# Patient Record
Sex: Male | Born: 1945 | Race: White | Hispanic: No | Marital: Married | Smoking: Never smoker
Health system: Southern US, Community
[De-identification: ages and names within clinical notes are randomized; demographics above are authoritative.]

## PROBLEM LIST (undated history)

## (undated) DIAGNOSIS — I10 Essential (primary) hypertension: Secondary | ICD-10-CM

## (undated) DIAGNOSIS — G4730 Sleep apnea, unspecified: Secondary | ICD-10-CM

## (undated) DIAGNOSIS — J3089 Other allergic rhinitis: Secondary | ICD-10-CM

## (undated) DIAGNOSIS — I2510 Atherosclerotic heart disease of native coronary artery without angina pectoris: Secondary | ICD-10-CM

## (undated) DIAGNOSIS — H269 Unspecified cataract: Secondary | ICD-10-CM

## (undated) DIAGNOSIS — H9190 Unspecified hearing loss, unspecified ear: Secondary | ICD-10-CM

## (undated) DIAGNOSIS — K429 Umbilical hernia without obstruction or gangrene: Secondary | ICD-10-CM

## (undated) DIAGNOSIS — K219 Gastro-esophageal reflux disease without esophagitis: Secondary | ICD-10-CM

## (undated) DIAGNOSIS — E119 Type 2 diabetes mellitus without complications: Secondary | ICD-10-CM

## (undated) DIAGNOSIS — R140 Abdominal distension (gaseous): Secondary | ICD-10-CM

## (undated) DIAGNOSIS — M1990 Unspecified osteoarthritis, unspecified site: Secondary | ICD-10-CM

## (undated) DIAGNOSIS — K760 Fatty (change of) liver, not elsewhere classified: Secondary | ICD-10-CM

## (undated) HISTORY — PX: CARDIAC CATHETERIZATION: SHX172

## (undated) HISTORY — PX: JOINT REPLACEMENT: SHX530

## (undated) HISTORY — DX: Abdominal distension (gaseous): R14.0

## (undated) HISTORY — DX: Type 2 diabetes mellitus without complications: E11.9

## (undated) HISTORY — PX: TREATMENT FISTULA ANAL: SUR1390

## (undated) HISTORY — PX: CHOLECYSTECTOMY: SHX55

---

## 1967-05-19 HISTORY — PX: TONSILLECTOMY: SUR1361

## 2000-05-18 HISTORY — PX: CORONARY ARTERY BYPASS GRAFT: SHX141

## 2000-12-22 ENCOUNTER — Encounter: Payer: Self-pay | Admitting: Cardiology

## 2000-12-22 ENCOUNTER — Inpatient Hospital Stay (HOSPITAL_COMMUNITY): Admission: RE | Admit: 2000-12-22 | Discharge: 2000-12-27 | Payer: Self-pay | Admitting: Cardiology

## 2000-12-23 ENCOUNTER — Encounter: Payer: Self-pay | Admitting: Thoracic Surgery (Cardiothoracic Vascular Surgery)

## 2000-12-24 ENCOUNTER — Encounter: Payer: Self-pay | Admitting: Thoracic Surgery (Cardiothoracic Vascular Surgery)

## 2000-12-25 ENCOUNTER — Encounter: Payer: Self-pay | Admitting: Thoracic Surgery (Cardiothoracic Vascular Surgery)

## 2001-01-18 ENCOUNTER — Encounter: Payer: Self-pay | Admitting: Thoracic Surgery (Cardiothoracic Vascular Surgery)

## 2001-01-18 ENCOUNTER — Encounter
Admission: RE | Admit: 2001-01-18 | Discharge: 2001-01-18 | Payer: Self-pay | Admitting: Thoracic Surgery (Cardiothoracic Vascular Surgery)

## 2001-01-31 ENCOUNTER — Encounter
Admission: RE | Admit: 2001-01-31 | Discharge: 2001-01-31 | Payer: Self-pay | Admitting: Thoracic Surgery (Cardiothoracic Vascular Surgery)

## 2001-01-31 ENCOUNTER — Encounter: Payer: Self-pay | Admitting: Thoracic Surgery (Cardiothoracic Vascular Surgery)

## 2001-07-18 ENCOUNTER — Encounter: Payer: Self-pay | Admitting: Thoracic Surgery (Cardiothoracic Vascular Surgery)

## 2001-07-18 ENCOUNTER — Encounter
Admission: RE | Admit: 2001-07-18 | Discharge: 2001-07-18 | Payer: Self-pay | Admitting: Thoracic Surgery (Cardiothoracic Vascular Surgery)

## 2002-09-29 ENCOUNTER — Ambulatory Visit (HOSPITAL_COMMUNITY): Admission: RE | Admit: 2002-09-29 | Discharge: 2002-09-29 | Payer: Self-pay | Admitting: Neurology

## 2004-03-25 ENCOUNTER — Ambulatory Visit: Payer: Self-pay | Admitting: Cardiology

## 2004-03-28 ENCOUNTER — Ambulatory Visit: Payer: Self-pay | Admitting: Cardiology

## 2004-04-08 ENCOUNTER — Ambulatory Visit: Payer: Self-pay | Admitting: Cardiology

## 2010-07-16 ENCOUNTER — Other Ambulatory Visit (HOSPITAL_COMMUNITY): Payer: Self-pay | Admitting: Orthopedic Surgery

## 2010-07-16 ENCOUNTER — Ambulatory Visit (HOSPITAL_COMMUNITY)
Admission: RE | Admit: 2010-07-16 | Discharge: 2010-07-16 | Disposition: A | Discharge status: Home / Self Care | Payer: BC Managed Care – PPO | Source: Ambulatory Visit | Attending: Orthopedic Surgery | Admitting: Orthopedic Surgery

## 2010-07-16 ENCOUNTER — Encounter (HOSPITAL_COMMUNITY)
Admission: RE | Admit: 2010-07-16 | Discharge: 2010-07-16 | Disposition: A | Discharge status: Home / Self Care | Payer: BC Managed Care – PPO | Source: Ambulatory Visit | Attending: Orthopedic Surgery | Admitting: Orthopedic Surgery

## 2010-07-16 DIAGNOSIS — R059 Cough, unspecified: Secondary | ICD-10-CM | POA: Insufficient documentation

## 2010-07-16 DIAGNOSIS — M19019 Primary osteoarthritis, unspecified shoulder: Secondary | ICD-10-CM | POA: Insufficient documentation

## 2010-07-16 DIAGNOSIS — Z01812 Encounter for preprocedural laboratory examination: Secondary | ICD-10-CM | POA: Insufficient documentation

## 2010-07-16 DIAGNOSIS — Z01818 Encounter for other preprocedural examination: Secondary | ICD-10-CM | POA: Insufficient documentation

## 2010-07-16 DIAGNOSIS — R05 Cough: Secondary | ICD-10-CM | POA: Insufficient documentation

## 2010-07-16 LAB — DIFFERENTIAL
Basophils Absolute: 0 10*3/uL (ref 0.0–0.1)
Eosinophils Relative: 2 % (ref 0–5)
Lymphocytes Relative: 24 % (ref 12–46)
Lymphs Abs: 1.2 10*3/uL (ref 0.7–4.0)
Monocytes Absolute: 0.4 10*3/uL (ref 0.1–1.0)
Neutro Abs: 3.4 10*3/uL (ref 1.7–7.7)

## 2010-07-16 LAB — URINALYSIS, ROUTINE W REFLEX MICROSCOPIC
Bilirubin Urine: NEGATIVE
Hgb urine dipstick: NEGATIVE
Protein, ur: NEGATIVE mg/dL
Specific Gravity, Urine: 1.027 (ref 1.005–1.030)
Urine Glucose, Fasting: 100 mg/dL — AB

## 2010-07-16 LAB — CBC
HCT: 45.6 % (ref 39.0–52.0)
Hemoglobin: 15.4 g/dL (ref 13.0–17.0)
MCHC: 33.8 g/dL (ref 30.0–36.0)
MCV: 89.8 fL (ref 78.0–100.0)
RDW: 12.8 % (ref 11.5–15.5)

## 2010-07-16 LAB — BASIC METABOLIC PANEL
Calcium: 9.3 mg/dL (ref 8.4–10.5)
GFR calc Af Amer: >60 mL/min (ref >60)
GFR calc non Af Amer: >60 mL/min (ref >60)
Potassium: 4.2 mEq/L (ref 3.5–5.1)
Sodium: 143 mEq/L (ref 135–145)

## 2010-07-16 LAB — TYPE AND SCREEN
ABO/RH(D): O POS
Antibody Screen: NEGATIVE

## 2010-07-16 LAB — PROTIME-INR
INR: 1.07 (ref 0.00–1.49)
Prothrombin Time: 14.1 seconds (ref 11.6–15.2)

## 2010-07-16 LAB — SURGICAL PCR SCREEN: MRSA, PCR: NEGATIVE

## 2010-07-17 HISTORY — PX: SHOULDER ARTHROSCOPY W/ ROTATOR CUFF REPAIR: SHX2400

## 2010-07-18 ENCOUNTER — Observation Stay (HOSPITAL_COMMUNITY)
Admission: RE | Admit: 2010-07-18 | Discharge: 2010-07-20 | Disposition: A | Discharge status: Home / Self Care | Payer: BC Managed Care – PPO | Source: Ambulatory Visit | Attending: Orthopedic Surgery | Admitting: Orthopedic Surgery

## 2010-07-18 ENCOUNTER — Inpatient Hospital Stay (HOSPITAL_COMMUNITY): Payer: BC Managed Care – PPO

## 2010-07-18 DIAGNOSIS — K219 Gastro-esophageal reflux disease without esophagitis: Secondary | ICD-10-CM | POA: Insufficient documentation

## 2010-07-18 DIAGNOSIS — I2510 Atherosclerotic heart disease of native coronary artery without angina pectoris: Secondary | ICD-10-CM | POA: Insufficient documentation

## 2010-07-18 DIAGNOSIS — Z951 Presence of aortocoronary bypass graft: Secondary | ICD-10-CM | POA: Insufficient documentation

## 2010-07-18 DIAGNOSIS — M19019 Primary osteoarthritis, unspecified shoulder: Principal | ICD-10-CM | POA: Insufficient documentation

## 2010-07-18 DIAGNOSIS — N400 Enlarged prostate without lower urinary tract symptoms: Secondary | ICD-10-CM | POA: Insufficient documentation

## 2010-07-18 DIAGNOSIS — Z79899 Other long term (current) drug therapy: Secondary | ICD-10-CM | POA: Insufficient documentation

## 2010-07-19 LAB — BASIC METABOLIC PANEL
BUN: 10 mg/dL (ref 6–23)
Calcium: 8.8 mg/dL (ref 8.4–10.5)
Creatinine, Ser: 0.88 mg/dL (ref 0.4–1.5)
GFR calc non Af Amer: >60 mL/min (ref >60)
Glucose, Bld: 108 mg/dL — ABNORMAL HIGH (ref 70–99)

## 2010-07-19 LAB — CBC
HCT: 36.4 % — ABNORMAL LOW (ref 39.0–52.0)
MCH: 29.9 pg (ref 26.0–34.0)
MCHC: 33.2 g/dL (ref 30.0–36.0)
MCV: 89.9 fL (ref 78.0–100.0)
RDW: 13 % (ref 11.5–15.5)

## 2010-08-02 NOTE — Op Note (Signed)
NAMEXZAYVION, VAETH NO.:  [id number]  MEDICAL RECORD NO.:  [id number]           PATIENT TYPE:  I  LOCATION:  5035                         FACILITY:  [facility]  PHYSICIAN:  Almedia Balls. Ranell Patrick, M.D. DATE OF BIRTH:  1946/02/06  DATE OF PROCEDURE:  07/18/2010 DATE OF DISCHARGE:                              OPERATIVE REPORT   PREOPERATIVE DIAGNOSIS:  Left shoulder, end-stage osteoarthritis.  POSTOPERATIVE DIAGNOSIS:  Left shoulder, end-stage osteoarthritis.  PROCEDURE PERFORMED:  Left total shoulder arthroplasty, DePuy global advantage system.  ATTENDING SURGEON:  Almedia Balls. Ranell Patrick, MD  ASSISTANT:  Donnie Coffin. Dixon, PA-C.  ANESTHESIA:  General anesthesia was used plus interscalene block.  ESTIMATED BLOOD LOSS:  150 mL.  FLUID REPLACEMENT:  2000 mL crystalloid.  INSTRUMENT COUNTS:  Correct.  COMPLICATIONS:  There were no complications.  Perioperative antibiotics were given.  INDICATIONS:  The patient is a 65 year old male with history of worsening arthritis in the left shoulder.  The patient has had progressive pain and loss of function in his left shoulder.  The patient presents with progressive arthritic changes on x-ray and clinical findings consistent with a arthritic left shoulder, desiring total shoulder arthroplasty.  Informed consent obtained.  DESCRIPTION OF THE OPERATION:  After adequate local anesthesia was achieved, the patient positioned in the modified beach-chair position. Left shoulder was sterilely prepped and draped in usual manner. Deltopectoral approach was utilized starting at the coracoid process, extending down to the anterior humerus.  Dissection down through subcutaneous tissues using the Bovie.  The deltopectoral interval was identified and the cephalic vein was taken laterally with the deltoid and pectoralis was taken medially.  We released the upper centimeter pectoralis off the humerus.  We then identified the conjoined  tendon, took that medially.  We took the subscap sharply off the lesser tuberosity using the Bovie.  We then placed #2 FiberWire suture in a modified Mason-Allen suture technique into the free end of the subscapularis tendon.  We then released soft tissue off the humerus progressively externally rotating the humerus and doing a full soft tissue release off the humeral neck.  We then went ahead and placed a T- handled Crego elevator at the top of the humeral head and resected using neck resection guide with an oscillating saw with about 15 degrees or so of retroversion.  At this point, we went ahead and prepared our humerus with sequential broaching up to size 14 with broach in place.  We then subluxed the humerus posteriorly.  We removed the anterior-inferior capsule and we were careful to protect the axillary nerve.  We then removed the posterior capsule carefully and placed retractors posteriorly.  We then used a curette to remove excess cartilage off the anterior aspect of the glenoid.  There was really no cartilage on the posterior aspect of glenoid.  We then placed our center guide pin for 48 glenoid and then did our reaming with the low profile reamer, down to subchondral bone.  Next, we went ahead and used the hand reamer to ream peripherally and then went ahead and drilled out our central peg to the anchor peg glenoid.  Once we did that,  we placed our guide for the peripheral holes and drilled those without complication.  We had good content bony purchase with all those holes.  We then went and we trialed with a 48 socket and we are happy with that.  We then went ahead and placed a 48 humeral head component on, by some  48 x 21 eccentric and then got the eccentricity superiorly and posteriorly with good humeral head coverage.  We reduced the shoulder, and we are happy with soft tissue balance.  We then removed trial implants.  We drilled holes in the proximal humerus at the lesser  tuberosity placed, #2 FiberWire suture in and therefore I repaired the subscapularis.  Next, we went ahead and impacted the real 14 stem.  This was global advantage stem and we then impaction grafting technique with lot of cancellous bone graft medially to help keep that stem out laterally straight up and down. Once that was impacted in the position, we again addressed the glenoid. We subluxed the humerus posteriorly, placed our retractors, removed the trial implant.  Then we went ahead and put in some Gelfoam soaked with thrombin into the peripheral holes, and allowed that to dry while we mixed the cement on the back table.  We used DePuy high viscosity cement, injected that into the holes peripherally and then placed our 48- anchor peg glenoid into position, impacted that held in place until the cement hardened.  Once the cement was hardened on the back table, we checked the glenoid implant which was stable grossly and then again delivered the humerus into good position where we could visualize that, we thoroughly irrigated and then placed the real 48 x 21 eccentric and impacted that in position with good head coverage, reduced the shoulder, thoroughly irrigated and then went ahead and repaired the subscapularis anatomically using 3 bone sutures as well as rotator interval suturing and had a nice anatomic repair, not under tension to a full arc of motion.  We were able to externally rotate about 45 degrees and really and really nice passive motion with nice gliding and no tethering and no impingement.  We then closed deltopectoral interval with 0 Vicryl suture followed by 2-0 Vicryl for subcutaneous closure and 4-0 Monocryl for skin.  Steri-Strips applied followed by sterile dressing.  The patient tolerated surgery well.     Almedia Balls. Ranell Patrick, M.D.     SRN/MEDQ  D:  07/18/2010  T:  07/19/2010  Job:  [id number]  Electronically Signed by Malon Kindle  on 08/02/2010 11:22:21 AM

## 2010-09-10 NOTE — Discharge Summary (Signed)
  Joseph Burton, Joseph Burton NO.:  [id number]  MEDICAL RECORD NO.:  [id number]           PATIENT TYPE:  I  LOCATION:  5035                         FACILITY:  [facility]  PHYSICIAN:  Almedia Balls. Ranell Patrick, M.D. DATE OF BIRTH:  March 14, 1946  DATE OF ADMISSION:  07/18/2010 DATE OF DISCHARGE:  07/20/2010                              DISCHARGE SUMMARY   ADMISSION DIAGNOSIS:  Left shoulder end-stage osteoarthritis.  DISCHARGE DIAGNOSIS:  Left shoulder end-stage osteoarthritis, status post total shoulder arthroplasty.  BRIEF HISTORY:  The patient is a 65 year old male with worsening left shoulder pain secondary to end-stage osteoarthritis.  The patient elected to have a left total shoulder arthroplasty by Dr. Malon Kindle to decrease pain and increase function.  PROCEDURE:  The patient had a left total shoulder arthroplasty by Dr. Malon Kindle on July 18, 2010.  Standley Dakins, [facility] was assistant. General anesthesia was used.  No complications.  HOSPITAL COURSE:  The patient was admitted on July 18, 2010, for the above-stated procedure which he tolerated well.  After adequate time in Post Anesthesia Care Unit, he was transferred up to 5000.  On postop day #1, the patient complained of minimal pain due to left shoulder, he was actually doing quite well.  Worked with Physical Therapy and Occupational Therapy quite well over the next 2 days and thus the patient will be discharged home with Home Health.  His labs were within acceptable limits.  The patient was afebrile.  Overall, his hospital stay was uneventful.  DISCHARGE PLAN:  The patient will be discharged home on July 21, 2010.  CONDITION:  Stable.  DIET:  Regular.  DISCHARGE MEDICATIONS: 1. Robaxin 500 mg p.o. q.6 hours. 2. Percocet 5/325, 1-2 tabs q.4-6 hours p.r.n. pain.     Thomas B. Dixon, P.A.   ______________________________ Almedia Balls. Ranell Patrick, M.D.    TBD/MEDQ  D:  08/21/2010  T:  08/22/2010  Job:   [id number]  Electronically Signed by Standley Dakins P.A. on 09/02/2010 10:18:33 AM Electronically Signed by Malon Kindle  on 09/10/2010 05:15:42 PM

## 2011-10-01 ENCOUNTER — Encounter (INDEPENDENT_AMBULATORY_CARE_PROVIDER_SITE_OTHER): Payer: Self-pay | Admitting: *Deleted

## 2011-10-15 ENCOUNTER — Ambulatory Visit (INDEPENDENT_AMBULATORY_CARE_PROVIDER_SITE_OTHER): Payer: BC Managed Care – PPO | Admitting: Internal Medicine

## 2015-07-01 DIAGNOSIS — E7800 Pure hypercholesterolemia, unspecified: Secondary | ICD-10-CM | POA: Diagnosis not present

## 2015-07-01 DIAGNOSIS — I2510 Atherosclerotic heart disease of native coronary artery without angina pectoris: Secondary | ICD-10-CM | POA: Diagnosis not present

## 2015-07-01 DIAGNOSIS — Z1211 Encounter for screening for malignant neoplasm of colon: Secondary | ICD-10-CM | POA: Diagnosis not present

## 2015-07-01 DIAGNOSIS — E1165 Type 2 diabetes mellitus with hyperglycemia: Secondary | ICD-10-CM | POA: Diagnosis not present

## 2015-07-02 ENCOUNTER — Encounter (INDEPENDENT_AMBULATORY_CARE_PROVIDER_SITE_OTHER): Payer: Self-pay | Admitting: *Deleted

## 2015-07-05 DIAGNOSIS — E1165 Type 2 diabetes mellitus with hyperglycemia: Secondary | ICD-10-CM | POA: Diagnosis not present

## 2015-07-11 ENCOUNTER — Other Ambulatory Visit (INDEPENDENT_AMBULATORY_CARE_PROVIDER_SITE_OTHER): Payer: Self-pay | Admitting: *Deleted

## 2015-07-11 ENCOUNTER — Encounter (INDEPENDENT_AMBULATORY_CARE_PROVIDER_SITE_OTHER): Payer: Self-pay | Admitting: *Deleted

## 2015-07-11 DIAGNOSIS — Z800 Family history of malignant neoplasm of digestive organs: Secondary | ICD-10-CM

## 2015-07-11 DIAGNOSIS — Z1211 Encounter for screening for malignant neoplasm of colon: Secondary | ICD-10-CM

## 2015-07-26 DIAGNOSIS — E7800 Pure hypercholesterolemia, unspecified: Secondary | ICD-10-CM | POA: Diagnosis not present

## 2015-07-26 DIAGNOSIS — E119 Type 2 diabetes mellitus without complications: Secondary | ICD-10-CM | POA: Diagnosis not present

## 2015-07-26 DIAGNOSIS — I10 Essential (primary) hypertension: Secondary | ICD-10-CM | POA: Diagnosis not present

## 2015-07-26 DIAGNOSIS — I2510 Atherosclerotic heart disease of native coronary artery without angina pectoris: Secondary | ICD-10-CM | POA: Diagnosis not present

## 2015-08-19 DIAGNOSIS — Z789 Other specified health status: Secondary | ICD-10-CM | POA: Diagnosis not present

## 2015-08-19 DIAGNOSIS — I2510 Atherosclerotic heart disease of native coronary artery without angina pectoris: Secondary | ICD-10-CM | POA: Diagnosis not present

## 2015-08-19 DIAGNOSIS — R05 Cough: Secondary | ICD-10-CM | POA: Diagnosis not present

## 2015-08-19 DIAGNOSIS — E1165 Type 2 diabetes mellitus with hyperglycemia: Secondary | ICD-10-CM | POA: Diagnosis not present

## 2015-08-19 DIAGNOSIS — Z6831 Body mass index (BMI) 31.0-31.9, adult: Secondary | ICD-10-CM | POA: Diagnosis not present

## 2015-08-19 DIAGNOSIS — J069 Acute upper respiratory infection, unspecified: Secondary | ICD-10-CM | POA: Diagnosis not present

## 2015-08-23 ENCOUNTER — Other Ambulatory Visit (INDEPENDENT_AMBULATORY_CARE_PROVIDER_SITE_OTHER): Payer: Self-pay | Admitting: *Deleted

## 2015-08-23 ENCOUNTER — Encounter (INDEPENDENT_AMBULATORY_CARE_PROVIDER_SITE_OTHER): Payer: Self-pay | Admitting: *Deleted

## 2015-08-23 MED ORDER — PEG 3350-KCL-NA BICARB-NACL 420 G PO SOLR: 4000.0000 mL | Freq: Once | ORAL | Status: DC

## 2015-08-23 NOTE — Telephone Encounter (Signed)
trilyte

## 2015-09-06 ENCOUNTER — Telehealth (INDEPENDENT_AMBULATORY_CARE_PROVIDER_SITE_OTHER): Payer: Self-pay | Admitting: *Deleted

## 2015-09-06 NOTE — Telephone Encounter (Signed)
Referring MD/PCP: vyas   Procedure: tcs  Reason/Indication:  Screening, fam hx colon ca  Has patient had this procedure before?  Yes, 10 yrs ago  If so, when, by whom and where?    Is there a family history of colon cancer?  Yes, father  Who?  What age when diagnosed?    Is patient diabetic?   borderline      Does patient have prosthetic heart valve or mechanical valve?  no  Do you have a pacemaker?  no  Has patient ever had endocarditis? No   Double bypass 2002  Has patient had joint replacement within last 12 months?  no  Does patient tend to be constipated or take laxatives? no  Does patient have a history of alcohol/drug use?  no  Is patient on Coumadin, Plavix and/or Aspirin? yes  Medications: asa 81 mg daily, atenolol 50 mg 1/2 tab daily, omeprazole 20 mg bid, uroxatral 10 mg daily, nabumetone 750 mg bid, gemfibrozil 600 mg daily, metformin 500 mg daily  Allergies: nkda  Medication Adjustment: asa 2 days, hold metformin evening before  Procedure date & time: 10/02/15 at 930

## 2015-09-10 NOTE — Telephone Encounter (Signed)
agree

## 2015-10-02 ENCOUNTER — Ambulatory Visit (HOSPITAL_COMMUNITY)
Admission: RE | Admit: 2015-10-02 | Discharge: 2015-10-02 | Disposition: A | Discharge status: Home / Self Care | Payer: Medicare Other | Source: Ambulatory Visit | Attending: Internal Medicine | Admitting: Internal Medicine

## 2015-10-02 ENCOUNTER — Encounter (HOSPITAL_COMMUNITY): Payer: Self-pay

## 2015-10-02 ENCOUNTER — Encounter (HOSPITAL_COMMUNITY)
Admission: RE | Disposition: A | Discharge status: Home / Self Care | Payer: Self-pay | Source: Ambulatory Visit | Attending: Internal Medicine

## 2015-10-02 DIAGNOSIS — K644 Residual hemorrhoidal skin tags: Secondary | ICD-10-CM | POA: Diagnosis not present

## 2015-10-02 DIAGNOSIS — M1991 Primary osteoarthritis, unspecified site: Secondary | ICD-10-CM | POA: Diagnosis not present

## 2015-10-02 DIAGNOSIS — Z7984 Long term (current) use of oral hypoglycemic drugs: Secondary | ICD-10-CM | POA: Insufficient documentation

## 2015-10-02 DIAGNOSIS — Z1211 Encounter for screening for malignant neoplasm of colon: Secondary | ICD-10-CM | POA: Diagnosis not present

## 2015-10-02 DIAGNOSIS — D120 Benign neoplasm of cecum: Secondary | ICD-10-CM | POA: Diagnosis not present

## 2015-10-02 DIAGNOSIS — Z800 Family history of malignant neoplasm of digestive organs: Secondary | ICD-10-CM | POA: Diagnosis not present

## 2015-10-02 DIAGNOSIS — E119 Type 2 diabetes mellitus without complications: Secondary | ICD-10-CM | POA: Diagnosis not present

## 2015-10-02 DIAGNOSIS — Z79899 Other long term (current) drug therapy: Secondary | ICD-10-CM | POA: Insufficient documentation

## 2015-10-02 DIAGNOSIS — K5730 Diverticulosis of large intestine without perforation or abscess without bleeding: Secondary | ICD-10-CM | POA: Insufficient documentation

## 2015-10-02 DIAGNOSIS — K648 Other hemorrhoids: Secondary | ICD-10-CM | POA: Diagnosis not present

## 2015-10-02 HISTORY — PX: COLONOSCOPY: SHX5424

## 2015-10-02 HISTORY — DX: Unspecified osteoarthritis, unspecified site: M19.90

## 2015-10-02 LAB — GLUCOSE, CAPILLARY: GLUCOSE-CAPILLARY: 140 mg/dL — AB (ref 65–99)

## 2015-10-02 SURGERY — COLONOSCOPY
Anesthesia: Moderate Sedation

## 2015-10-02 MED ORDER — MIDAZOLAM HCL 5 MG/5ML IJ SOLN
INTRAMUSCULAR | Status: DC | PRN
  Administered 2015-10-02 (×3): 2 mg via INTRAVENOUS

## 2015-10-02 MED ORDER — MIDAZOLAM HCL 5 MG/5ML IJ SOLN
INTRAMUSCULAR | Status: AC
  Filled 2015-10-02: qty 10

## 2015-10-02 MED ORDER — SODIUM CHLORIDE 0.9 % IV SOLN
INTRAVENOUS | Status: DC
  Administered 2015-10-02: 09:00:00 via INTRAVENOUS

## 2015-10-02 MED ORDER — MEPERIDINE HCL 50 MG/ML IJ SOLN
INTRAMUSCULAR | Status: AC
  Filled 2015-10-02: qty 1

## 2015-10-02 MED ORDER — MEPERIDINE HCL 50 MG/ML IJ SOLN
INTRAMUSCULAR | Status: DC | PRN
  Administered 2015-10-02 (×2): 25 mg via INTRAVENOUS

## 2015-10-02 MED ORDER — STERILE WATER FOR IRRIGATION IR SOLN
Status: DC | PRN
  Administered 2015-10-02: 09:00:00

## 2015-10-02 NOTE — H&P (Signed)
Joseph Burton is an 70 y.o. male.   Chief Complaint: Patient is here for colonoscopy. HPI: Joseph Burton is 70 year old Caucasian male who is in for screening colonoscopy. Last exam was normal 10 years. He denies abdominal pain change in bowel habits or rectal bleeding. However he did notice blood on the tissue after he completed prep last night. Family history is significant for CRC but his father was 49 years old, diagnosis.  Past Medical History  Diagnosis Date  . Diabetes mellitus without complication ([place])   . Arthritis     Past Surgical History  Procedure Laterality Date  . Shoulder arthroscopy w/ rotator cuff repair Left march 2012  . Cardiac surgery  2002  . Cholecystectomy      3 years ago  . Tonsillectomy  1969    Family History  Problem Relation Age of Onset  . Colon cancer Father    Social History:  reports that he has never smoked. He does not have any smokeless tobacco history on file. He reports that he does not drink alcohol or use illicit drugs.  Allergies: Not on File  Medications Prior to Admission  Medication Sig Dispense Refill  . alfuzosin (UROXATRAL) 10 MG 24 hr tablet Take 10 mg by mouth daily with breakfast.     . gemfibrozil (LOPID) 600 MG tablet Take 600 mg by mouth daily.     . metFORMIN (GLUCOPHAGE-XR) 500 MG 24 hr tablet Take 500 mg by mouth daily with breakfast.     . nabumetone (RELAFEN) 750 MG tablet Take 750 mg by mouth 2 (two) times daily.     Marland Kitchen omeprazole (PRILOSEC) 20 MG capsule Take 20 mg by mouth 2 (two) times daily before a meal.     . polyethylene glycol-electrolytes (NULYTELY/GOLYTELY) 420 g solution Take 4,000 mLs by mouth once. 4000 mL 0    No results found for this or any previous visit (from the past 48 hour(s)). No results found.  ROS  Blood pressure 147/79, pulse 76, temperature 97.5 F (36.4 C), temperature source Oral, resp. rate 16, height 5' 9.5" (1.765 m), weight 205 lb (92.987 kg), SpO2 98 %. Physical Exam   Constitutional: He appears well-developed and well-nourished.  HENT:  Mouth/Throat: Oropharynx is clear and moist.  Eyes: Conjunctivae are normal. No scleral icterus.  Neck: No thyromegaly present.  Cardiovascular: Normal rate, regular rhythm and normal heart sounds.   No murmur heard. Respiratory: Effort normal and breath sounds normal.  GI: Soft. He exhibits no distension and no mass. There is no tenderness.  Musculoskeletal: He exhibits no edema.  Lymphadenopathy:    He has no cervical adenopathy.  Neurological: He is alert.  Skin: Skin is warm and dry.     Assessment/Plan Average risk screening colonoscopy. Family history of CRC in first-degree relative at late onset.  Rogene Houston, MD 10/02/2015, 9:12 AM

## 2015-10-02 NOTE — Discharge Instructions (Signed)
Resume usual medications and high fiber diet. No driving for 24 hours. Physician will call with biopsy results.     Colonoscopy, Care After These instructions give you information on caring for yourself after your procedure. Your doctor may also give you more specific instructions. Call your doctor if you have any problems or questions after your procedure. HOME CARE  Do not drive for 24 hours.  Do not sign important papers or use machinery for 24 hours.  You may shower.  You may go back to your usual activities, but go slower for the first 24 hours.  Take rest breaks often during the first 24 hours.  Walk around or use warm packs on your belly (abdomen) if you have belly cramping or gas.  Drink enough fluids to keep your pee (urine) clear or pale yellow.  Resume your normal diet. Avoid heavy or fried foods.  Avoid drinking alcohol for 24 hours or as told by your doctor.  Only take medicines as told by your doctor. If a tissue sample (biopsy) was taken during the procedure:   Do not take aspirin or blood thinners for 7 days, or as told by your doctor.  Do not drink alcohol for 7 days, or as told by your doctor.  Eat soft foods for the first 24 hours. GET HELP IF: You still have a small amount of blood in your poop (stool) 2-3 days after the procedure. GET HELP RIGHT AWAY IF:  You have more than a small amount of blood in your poop.  You see clumps of tissue (blood clots) in your poop.  Your belly is puffy (swollen).  You feel sick to your stomach (nauseous) or throw up (vomit).  You have a fever.  You have belly pain that gets worse and medicine does not help. MAKE SURE YOU:  Understand these instructions.  Will watch your condition.  Will get help right away if you are not doing well or get worse.   This information is not intended to replace advice given to you by your health care provider. Make sure you discuss any questions you have with your health  care provider.   Document Released: 06/06/2010 Document Revised: 05/09/2013 Document Reviewed: 01/09/2013 Elsevier Interactive Patient Education 2016 [employer] Inc[employer]    Colon Polyps Polyps are lumps of extra tissue growing inside the body. Polyps can grow in the large intestine (colon). Most colon polyps are noncancerous (benign). However, some colon polyps can become cancerous over time. Polyps that are larger than a pea may be harmful. To be safe, caregivers remove and test all polyps. CAUSES  Polyps form when mutations in the genes cause your cells to grow and divide even though no more tissue is needed. RISK FACTORS There are a number of risk factors that can increase your chances of getting colon polyps. They include:  Being older than 50 years.  Family history of colon polyps or colon cancer.  Long-term colon diseases, such as colitis or Crohn disease.  Being overweight.  Smoking.  Being inactive.  Drinking too much alcohol. SYMPTOMS  Most small polyps do not cause symptoms. If symptoms are present, they may include:  Blood in the stool. The stool may look dark red or black.  Constipation or diarrhea that lasts longer than 1 week. DIAGNOSIS People often do not know they have polyps until their caregiver finds them during a regular checkup. Your caregiver can use 4 tests to check for polyps:  Digital rectal exam. The caregiver wears gloves  and feels inside the rectum. This test would find polyps only in the rectum.  Barium enema. The caregiver puts a liquid called barium into your rectum before taking X-rays of your colon. Barium makes your colon look white. Polyps are dark, so they are easy to see in the X-ray pictures.  Sigmoidoscopy. A thin, flexible tube (sigmoidoscope) is placed into your rectum. The sigmoidoscope has a light and tiny camera in it. The caregiver uses the sigmoidoscope to look at the last third of your colon.  Colonoscopy. This test is like  sigmoidoscopy, but the caregiver looks at the entire colon. This is the most common method for finding and removing polyps. TREATMENT  Any polyps will be removed during a sigmoidoscopy or colonoscopy. The polyps are then tested for cancer. PREVENTION  To help lower your risk of getting more colon polyps:  Eat plenty of fruits and vegetables. Avoid eating fatty foods.  Do not smoke.  Avoid drinking alcohol.  Exercise every day.  Lose weight if recommended by your caregiver.  Eat plenty of calcium and folate. Foods that are rich in calcium include milk, cheese, and broccoli. Foods that are rich in folate include chickpeas, kidney beans, and spinach. HOME CARE INSTRUCTIONS Keep all follow-up appointments as directed by your caregiver. You may need periodic exams to check for polyps. SEEK MEDICAL CARE IF: You notice bleeding during a bowel movement.   This information is not intended to replace advice given to you by your health care provider. Make sure you discuss any questions you have with your health care provider.   Document Released: 01/29/2004 Document Revised: 05/25/2014 Document Reviewed: 07/14/2011 Elsevier Interactive Patient Education 2016 [employer].   Diverticulosis Diverticulosis is the condition that develops when small pouches (diverticula) form in the wall of your colon. Your colon, or large intestine, is where water is absorbed and stool is formed. The pouches form when the inside layer of your colon pushes through weak spots in the outer layers of your colon. CAUSES  No one knows exactly what causes diverticulosis. RISK FACTORS  Being older than 13. Your risk for this condition increases with age. Diverticulosis is rare in people younger than 40 years. By age 65, almost everyone has it.  Eating a low-fiber diet.  Being frequently constipated.  Being overweight.  Not getting enough exercise.  Smoking.  Taking over-the-counter pain medicines, like  aspirin and ibuprofen. SYMPTOMS  Most people with diverticulosis do not have symptoms. DIAGNOSIS  Because diverticulosis often has no symptoms, health care providers often discover the condition during an exam for other colon problems. In many cases, a health care provider will diagnose diverticulosis while using a flexible scope to examine the colon (colonoscopy). TREATMENT  If you have never developed an infection related to diverticulosis, you may not need treatment. If you have had an infection before, treatment may include:  Eating more fruits, vegetables, and grains.  Taking a fiber supplement.  Taking a live bacteria supplement (probiotic).  Taking medicine to relax your colon. HOME CARE INSTRUCTIONS   Drink at least 6-8 glasses of water each day to prevent constipation.  Try not to strain when you have a bowel movement.  Keep all follow-up appointments. If you have had an infection before:  Increase the fiber in your diet as directed by your health care provider or dietitian.  Take a dietary fiber supplement if your health care provider approves.  Only take medicines as directed by your health care provider. SEEK MEDICAL CARE IF:  You have abdominal pain.  You have bloating.  You have cramps.  You have not gone to the bathroom in 3 days. SEEK IMMEDIATE MEDICAL CARE IF:   Your pain gets worse.  Yourbloating becomes very bad.  You have a fever or chills, and your symptoms suddenly get worse.  You begin vomiting.  You have bowel movements that are bloody or black. MAKE SURE YOU:  Understand these instructions.  Will watch your condition.  Will get help right away if you are not doing well or get worse.   This information is not intended to replace advice given to you by your health care provider. Make sure you discuss any questions you have with your health care provider.   Document Released: 01/30/2004 Document Revised: 05/09/2013 Document Reviewed:  03/29/2013 Elsevier Interactive Patient Education 2016 Elsevier Inc.   High-Fiber Diet Fiber, also called dietary fiber, is a type of carbohydrate found in fruits, vegetables, whole grains, and beans. A high-fiber diet can have many health benefits. Your health care provider may recommend a high-fiber diet to help:  Prevent constipation. Fiber can make your bowel movements more regular.  Lower your cholesterol.  Relieve hemorrhoids, uncomplicated diverticulosis, or irritable bowel syndrome.  Prevent overeating as part of a weight-loss plan.  Prevent heart disease, type 2 diabetes, and certain cancers. WHAT IS MY PLAN? The recommended daily intake of fiber includes:  38 grams for men under age 34.  62 grams for men over age 90.  51 grams for women under age 63.  16 grams for women over age 52. You can get the recommended daily intake of dietary fiber by eating a variety of fruits, vegetables, grains, and beans. Your health care provider may also recommend a fiber supplement if it is not possible to get enough fiber through your diet. WHAT DO I NEED TO KNOW ABOUT A HIGH-FIBER DIET?  Fiber supplements have not been widely studied for their effectiveness, so it is better to get fiber through food sources.  Always check the fiber content on thenutrition facts label of any prepackaged food. Look for foods that contain at least 5 grams of fiber per serving.  Ask your dietitian if you have questions about specific foods that are related to your condition, especially if those foods are not listed in the following section.  Increase your daily fiber consumption gradually. Increasing your intake of dietary fiber too quickly may cause bloating, cramping, or gas.  Drink plenty of water. Water helps you to digest fiber. WHAT FOODS CAN I EAT? Grains Whole-grain breads. Multigrain cereal. Oats and oatmeal. Brown rice. Barley. Bulgur wheat. [place]. Bran muffins. Popcorn. Rye wafer  crackers. Vegetables Sweet potatoes. Spinach. Kale. Artichokes. Cabbage. Broccoli. Green peas. Carrots. Squash. Fruits Berries. Pears. Apples. Oranges. Avocados. Prunes and raisins. Dried figs. Meats and Other Protein Sources [place], kidney, pinto, and soy beans. Split peas. Lentils. Nuts and seeds. Dairy Fiber-fortified yogurt. Beverages Fiber-fortified soy milk. Fiber-fortified orange juice. Other Fiber bars. The items listed above may not be a complete list of recommended foods or beverages. Contact your dietitian for more options. WHAT FOODS ARE NOT RECOMMENDED? Grains White bread. Pasta made with refined flour. White rice. Vegetables Fried potatoes. Canned vegetables. Well-cooked vegetables.  Fruits Fruit juice. Cooked, strained fruit. Meats and Other Protein Sources Fatty cuts of meat. Fried Sales executive or fried fish. Dairy Milk. Yogurt. Cream cheese. Sour cream. Beverages Soft drinks. Other Cakes and pastries. Butter and oils. The items listed above may not be a complete list  of foods and beverages to avoid. Contact your dietitian for more information. WHAT ARE SOME TIPS FOR INCLUDING HIGH-FIBER FOODS IN MY DIET?  Eat a wide variety of high-fiber foods.  Make sure that half of all grains consumed each day are whole grains.  Replace breads and cereals made from refined flour or white flour with whole-grain breads and cereals.  Replace white rice with brown rice, bulgur wheat, or millet.  Start the day with a breakfast that is high in fiber, such as a cereal that contains at least 5 grams of fiber per serving.  Use beans in place of meat in soups, salads, or pasta.  Eat high-fiber snacks, such as berries, raw vegetables, nuts, or popcorn.   This information is not intended to replace advice given to you by your health care provider. Make sure you discuss any questions you have with your health care provider.   Document Released: 05/04/2005 Document Revised: 05/25/2014  Document Reviewed: 10/17/2013 Elsevier Interactive Patient Education 2016 [employer].   Hemorrhoids Hemorrhoids are swollen veins around the rectum or anus. There are two types of hemorrhoids:   Internal hemorrhoids. These occur in the veins just inside the rectum. They may poke through to the outside and become irritated and painful.  External hemorrhoids. These occur in the veins outside the anus and can be felt as a painful swelling or hard lump near the anus. CAUSES  Pregnancy.   Obesity.   Constipation or diarrhea.   Straining to have a bowel movement.   Sitting for long periods on the toilet.  Heavy lifting or other activity that caused you to strain.  Anal intercourse. SYMPTOMS   Pain.   Anal itching or irritation.   Rectal bleeding.   Fecal leakage.   Anal swelling.   One or more lumps around the anus.  DIAGNOSIS  Your caregiver may be able to diagnose hemorrhoids by visual examination. Other examinations or tests that may be performed include:   Examination of the rectal area with a gloved hand (digital rectal exam).   Examination of anal canal using a small tube (scope).   A blood test if you have lost a significant amount of blood.  A test to look inside the colon (sigmoidoscopy or colonoscopy). TREATMENT Most hemorrhoids can be treated at home. However, if symptoms do not seem to be getting better or if you have a lot of rectal bleeding, your caregiver may perform a procedure to help make the hemorrhoids get smaller or remove them completely. Possible treatments include:   Placing a rubber band at the base of the hemorrhoid to cut off the circulation (rubber band ligation).   Injecting a chemical to shrink the hemorrhoid (sclerotherapy).   Using a tool to burn the hemorrhoid (infrared light therapy).   Surgically removing the hemorrhoid (hemorrhoidectomy).   Stapling the hemorrhoid to block blood flow to the tissue (hemorrhoid  stapling).  HOME CARE INSTRUCTIONS   Eat foods with fiber, such as whole grains, beans, nuts, fruits, and vegetables. Ask your doctor about taking products with added fiber in them (fibersupplements).  Increase fluid intake. Drink enough water and fluids to keep your urine clear or pale yellow.   Exercise regularly.   Go to the bathroom when you have the urge to have a bowel movement. Do not wait.   Avoid straining to have bowel movements.   Keep the anal area dry and clean. Use wet toilet paper or moist towelettes after a bowel movement.   Medicated  creams and suppositories may be used or applied as directed.   Only take over-the-counter or prescription medicines as directed by your caregiver.   Take warm sitz baths for 15-20 minutes, 3-4 times a day to ease pain and discomfort.   Place ice packs on the hemorrhoids if they are tender and swollen. Using ice packs between sitz baths may be helpful.   Put ice in a plastic bag.   Place a towel between your skin and the bag.   Leave the ice on for 15-20 minutes, 3-4 times a day.   Do not use a donut-shaped pillow or sit on the toilet for long periods. This increases blood pooling and pain.  SEEK MEDICAL CARE IF:  You have increasing pain and swelling that is not controlled by treatment or medicine.  You have uncontrolled bleeding.  You have difficulty or you are unable to have a bowel movement.  You have pain or inflammation outside the area of the hemorrhoids. MAKE SURE YOU:  Understand these instructions.  Will watch your condition.  Will get help right away if you are not doing well or get worse.   This information is not intended to replace advice given to you by your health care provider. Make sure you discuss any questions you have with your health care provider.   Document Released: 05/01/2000 Document Revised: 04/20/2012 Document Reviewed: 03/08/2012 Elsevier Interactive Patient Education [employer]

## 2015-10-02 NOTE — Op Note (Signed)
[facility] Patient Name: Joseph Burton Procedure Date: 10/02/2015 9:07 AM MRN: [id number] Date of Birth: 1946/01/09 Attending MD: Hildred Laser , MD CSN: [id number] Age: 70 Admit Type: Outpatient Procedure:                Colonoscopy Indications:              Screening for colorectal malignant neoplasm.                            Colorectal cancer in father at age 75. Providers:                Hildred Laser, MD, Rosina Lowenstein, RN, Isabella Stalling, Technician Referring MD:             Glenda Chroman, MD Medicines:                Meperidine 50 mg IV, Midazolam 6 mg IV Complications:            No immediate complications. Estimated Blood Loss:     Estimated blood loss was minimal. Procedure:                Pre-Anesthesia Assessment:                           - Prior to the procedure, a History and Physical                            was performed, and patient medications and                            allergies were reviewed. The patient's tolerance of                            previous anesthesia was also reviewed. The risks                            and benefits of the procedure and the sedation                            options and risks were discussed with the patient.                            All questions were answered, and informed consent                            was obtained. Prior Anticoagulants: The patient                            last took previous NSAID medication 1 day prior to                            the procedure. ASA Grade Assessment: II - A patient  with mild systemic disease. After reviewing the                            risks and benefits, the patient was deemed in                            satisfactory condition to undergo the procedure.                           After obtaining informed consent, the colonoscope                            was passed under direct vision. Throughout the                      procedure, the patient's blood pressure, pulse, and                            oxygen saturations were monitored continuously. The                            EC-[id number]) scope was introduced through                            the anus and advanced to the the cecum, identified                            by appendiceal orifice and ileocecal valve. The                            colonoscopy was performed without difficulty. The                            patient tolerated the procedure well. The quality                            of the bowel preparation was excellent. The                            ileocecal valve, appendiceal orifice, and rectum                            were photographed. Scope In: 9:20:46 AM Scope Out: 9:42:19 AM Scope Withdrawal Time: 0 hours 12 minutes 14 seconds  Total Procedure Duration: 0 hours 21 minutes 33 seconds  Findings:      Two sessile polyps were found in the cecum. These were biopsied with a       cold forceps for histology.      A few small-mouthed diverticula were found in the sigmoid colon.      External and internal hemorrhoids were found during retroflexion. The       hemorrhoids were small. Impression:               - Two 3 mm polyps in the cecum. Biopsied.                           -  Diverticulosis in the sigmoid colon.                           - External and internal hemorrhoids. Moderate Sedation:      Moderate (conscious) sedation was administered by the endoscopy nurse       and supervised by the endoscopist. The following parameters were       monitored: oxygen saturation, heart rate, blood pressure, CO2       capnography and response to care. Total physician intraservice time was       24 minutes. Recommendation:           - Patient has a contact number available for                            emergencies. The signs and symptoms of potential                            delayed complications were discussed with  the                            patient. Return to normal activities tomorrow.                            Written discharge instructions were provided to the                            patient.                           - Patient has a contact number available for                            emergencies. The signs and symptoms of potential                            delayed complications were discussed with the                            patient. Return to normal activities tomorrow.                            Written discharge instructions were provided to the                            patient.                           - High fiber diet today.                           - Continue present medications.                           - Repeat colonoscopy for surveillance based on  pathology results. Procedure Code(s):        --- Professional ---                           [phone], Colonoscopy, flexible; with biopsy, single                            or multiple                           99152, Moderate sedation services provided by the                            same physician or other qualified health care                            professional performing the diagnostic or                            therapeutic service that the sedation supports,                            requiring the presence of an independent trained                            observer to assist in the monitoring of the                            patient's level of consciousness and physiological                            status; initial 15 minutes of intraservice time,                            patient age 65 years or older                           [phone], Moderate sedation services; each additional                            15 minutes intraservice time Diagnosis Code(s):        --- Professional ---                           Z12.11, Encounter for screening for malignant                             neoplasm of colon                           Z80.0, Family history of malignant neoplasm of                            digestive organs  D12.0, Benign neoplasm of cecum                           K64.8, Other hemorrhoids                           K57.30, Diverticulosis of large intestine without                            perforation or abscess without bleeding CPT copyright 2016 American [employer] Association. All rights reserved. The codes documented in this report are preliminary and upon coder review may  be revised to meet current compliance requirements. Hildred Laser, MD Hildred Laser, MD 10/02/2015 9:52:17 AM This report has been signed electronically. Number of Addenda: 0

## 2015-10-03 DIAGNOSIS — E1165 Type 2 diabetes mellitus with hyperglycemia: Secondary | ICD-10-CM | POA: Diagnosis not present

## 2015-10-03 DIAGNOSIS — K30 Functional dyspepsia: Secondary | ICD-10-CM | POA: Diagnosis not present

## 2015-10-04 ENCOUNTER — Encounter (HOSPITAL_COMMUNITY): Payer: Self-pay | Admitting: Internal Medicine

## 2015-10-11 DIAGNOSIS — I10 Essential (primary) hypertension: Secondary | ICD-10-CM | POA: Diagnosis not present

## 2015-10-11 DIAGNOSIS — E7800 Pure hypercholesterolemia, unspecified: Secondary | ICD-10-CM | POA: Diagnosis not present

## 2015-10-11 DIAGNOSIS — E119 Type 2 diabetes mellitus without complications: Secondary | ICD-10-CM | POA: Diagnosis not present

## 2015-10-11 DIAGNOSIS — I2510 Atherosclerotic heart disease of native coronary artery without angina pectoris: Secondary | ICD-10-CM | POA: Diagnosis not present

## 2015-11-15 DIAGNOSIS — I2510 Atherosclerotic heart disease of native coronary artery without angina pectoris: Secondary | ICD-10-CM | POA: Diagnosis not present

## 2015-11-15 DIAGNOSIS — E119 Type 2 diabetes mellitus without complications: Secondary | ICD-10-CM | POA: Diagnosis not present

## 2015-11-15 DIAGNOSIS — E7800 Pure hypercholesterolemia, unspecified: Secondary | ICD-10-CM | POA: Diagnosis not present

## 2015-11-15 DIAGNOSIS — I10 Essential (primary) hypertension: Secondary | ICD-10-CM | POA: Diagnosis not present

## 2015-12-16 DIAGNOSIS — I10 Essential (primary) hypertension: Secondary | ICD-10-CM | POA: Diagnosis not present

## 2015-12-16 DIAGNOSIS — E119 Type 2 diabetes mellitus without complications: Secondary | ICD-10-CM | POA: Diagnosis not present

## 2015-12-16 DIAGNOSIS — I2510 Atherosclerotic heart disease of native coronary artery without angina pectoris: Secondary | ICD-10-CM | POA: Diagnosis not present

## 2015-12-16 DIAGNOSIS — E7800 Pure hypercholesterolemia, unspecified: Secondary | ICD-10-CM | POA: Diagnosis not present

## 2015-12-27 DIAGNOSIS — Z713 Dietary counseling and surveillance: Secondary | ICD-10-CM | POA: Diagnosis not present

## 2015-12-27 DIAGNOSIS — R109 Unspecified abdominal pain: Secondary | ICD-10-CM | POA: Diagnosis not present

## 2015-12-27 DIAGNOSIS — K420 Umbilical hernia with obstruction, without gangrene: Secondary | ICD-10-CM | POA: Diagnosis not present

## 2015-12-27 DIAGNOSIS — Z6831 Body mass index (BMI) 31.0-31.9, adult: Secondary | ICD-10-CM | POA: Diagnosis not present

## 2016-01-07 DIAGNOSIS — K429 Umbilical hernia without obstruction or gangrene: Secondary | ICD-10-CM | POA: Diagnosis not present

## 2016-01-09 DIAGNOSIS — R140 Abdominal distension (gaseous): Secondary | ICD-10-CM | POA: Diagnosis not present

## 2016-01-09 DIAGNOSIS — Z951 Presence of aortocoronary bypass graft: Secondary | ICD-10-CM | POA: Diagnosis not present

## 2016-01-09 DIAGNOSIS — I700 Atherosclerosis of aorta: Secondary | ICD-10-CM | POA: Diagnosis not present

## 2016-01-09 DIAGNOSIS — E1165 Type 2 diabetes mellitus with hyperglycemia: Secondary | ICD-10-CM | POA: Diagnosis not present

## 2016-01-09 DIAGNOSIS — Z96611 Presence of right artificial shoulder joint: Secondary | ICD-10-CM | POA: Diagnosis not present

## 2016-01-25 ENCOUNTER — Ambulatory Visit: Payer: Self-pay | Admitting: General Surgery

## 2016-01-27 DIAGNOSIS — K760 Fatty (change of) liver, not elsewhere classified: Secondary | ICD-10-CM | POA: Diagnosis not present

## 2016-01-27 DIAGNOSIS — N281 Cyst of kidney, acquired: Secondary | ICD-10-CM | POA: Diagnosis not present

## 2016-01-27 DIAGNOSIS — R109 Unspecified abdominal pain: Secondary | ICD-10-CM | POA: Diagnosis not present

## 2016-02-20 DIAGNOSIS — Z299 Encounter for prophylactic measures, unspecified: Secondary | ICD-10-CM | POA: Diagnosis not present

## 2016-02-20 DIAGNOSIS — I10 Essential (primary) hypertension: Secondary | ICD-10-CM | POA: Diagnosis not present

## 2016-02-20 DIAGNOSIS — J329 Chronic sinusitis, unspecified: Secondary | ICD-10-CM | POA: Diagnosis not present

## 2016-02-26 DIAGNOSIS — Z6830 Body mass index (BMI) 30.0-30.9, adult: Secondary | ICD-10-CM | POA: Diagnosis not present

## 2016-02-26 DIAGNOSIS — J069 Acute upper respiratory infection, unspecified: Secondary | ICD-10-CM | POA: Diagnosis not present

## 2016-02-26 DIAGNOSIS — Z713 Dietary counseling and surveillance: Secondary | ICD-10-CM | POA: Diagnosis not present

## 2016-02-26 DIAGNOSIS — I10 Essential (primary) hypertension: Secondary | ICD-10-CM | POA: Diagnosis not present

## 2016-03-02 DIAGNOSIS — H6123 Impacted cerumen, bilateral: Secondary | ICD-10-CM | POA: Diagnosis not present

## 2016-03-05 ENCOUNTER — Encounter (HOSPITAL_COMMUNITY): Payer: Self-pay

## 2016-03-05 ENCOUNTER — Encounter (HOSPITAL_COMMUNITY)
Admission: RE | Admit: 2016-03-05 | Discharge: 2016-03-05 | Disposition: A | Discharge status: Home / Self Care | Payer: Medicare Other | Source: Ambulatory Visit | Attending: General Surgery | Admitting: General Surgery

## 2016-03-05 ENCOUNTER — Encounter (HOSPITAL_COMMUNITY): Payer: Self-pay | Admitting: Vascular Surgery

## 2016-03-05 DIAGNOSIS — Z01810 Encounter for preprocedural cardiovascular examination: Secondary | ICD-10-CM | POA: Diagnosis not present

## 2016-03-05 DIAGNOSIS — K429 Umbilical hernia without obstruction or gangrene: Secondary | ICD-10-CM | POA: Diagnosis not present

## 2016-03-05 DIAGNOSIS — Z01812 Encounter for preprocedural laboratory examination: Secondary | ICD-10-CM | POA: Insufficient documentation

## 2016-03-05 HISTORY — DX: Umbilical hernia without obstruction or gangrene: K42.9

## 2016-03-05 HISTORY — DX: Other allergic rhinitis: J30.89

## 2016-03-05 HISTORY — DX: Unspecified cataract: H26.9

## 2016-03-05 HISTORY — DX: Fatty (change of) liver, not elsewhere classified: K76.0

## 2016-03-05 HISTORY — DX: Unspecified hearing loss, unspecified ear: H91.90

## 2016-03-05 HISTORY — DX: Atherosclerotic heart disease of native coronary artery without angina pectoris: I25.10

## 2016-03-05 HISTORY — DX: Gastro-esophageal reflux disease without esophagitis: K21.9

## 2016-03-05 HISTORY — DX: Sleep apnea, unspecified: G47.30

## 2016-03-05 LAB — CBC
HEMATOCRIT: 45.1 % (ref 39.0–52.0)
Hemoglobin: 15.8 g/dL (ref 13.0–17.0)
MCH: 31.3 pg (ref 26.0–34.0)
MCHC: 35 g/dL (ref 30.0–36.0)
MCV: 89.5 fL (ref 78.0–100.0)
PLATELETS: 193 10*3/uL (ref 150–400)
RBC: 5.04 MIL/uL (ref 4.22–5.81)
RDW: 12.8 % (ref 11.5–15.5)
WBC: 7.7 10*3/uL (ref 4.0–10.5)

## 2016-03-05 LAB — COMPREHENSIVE METABOLIC PANEL
ALBUMIN: 4.3 g/dL (ref 3.5–5.0)
ALT: 24 U/L (ref 17–63)
AST: 21 U/L (ref 15–41)
Alkaline Phosphatase: 66 U/L (ref 38–126)
Anion gap: 7 (ref 5–15)
BUN: 13 mg/dL (ref 6–20)
CHLORIDE: 106 mmol/L (ref 101–111)
CO2: 26 mmol/L (ref 22–32)
CREATININE: 0.9 mg/dL (ref 0.61–1.24)
Calcium: 9.7 mg/dL (ref 8.9–10.3)
GFR calc Af Amer: >60 mL/min (ref >60)
GLUCOSE: 137 mg/dL — AB (ref 65–99)
POTASSIUM: 4.1 mmol/L (ref 3.5–5.1)
SODIUM: 139 mmol/L (ref 135–145)
Total Bilirubin: 0.7 mg/dL (ref 0.3–1.2)
Total Protein: 7.3 g/dL (ref 6.5–8.1)

## 2016-03-05 LAB — GLUCOSE, CAPILLARY: GLUCOSE-CAPILLARY: 124 mg/dL — AB (ref 65–99)

## 2016-03-05 NOTE — Progress Notes (Signed)
Pt denies SOB, chest pain, and being under the care of a cardiologist. Pt stated that he last saw a cardiologist, Dr. Salli Real " in [place] after my heart surgery. " Pt stated that an A1c was done at PCP, Dr. Woody Seller last month; records requested. Pt stated that his last echo was done > 10 years ago.  Pt stated that he was instructed to stop taking Aspirin ( via telephone call ) on March 07, 2016. Pt stated that his fasting blood glucose usually ranges between 118-130. Pt chart forwarded to anesthesia for review ( see note).

## 2016-03-05 NOTE — Pre-Procedure Instructions (Signed)
Joseph Burton  03/05/2016      [facility] [address], [name] Phone: [phone] Fax: [phone]    Your procedure is scheduled on Friday, March 13, 2016  Report to [facility] Admitting at 7:15 A.M.  Call this number if you have problems the morning of surgery:  [phone]   Remember: Follow doctors instructions regarding Aspirin  Do not eat food or drink liquids after midnight Thursday, March 12, 2016  Take these medicines the morning of surgery with A SIP OF WATER : famotidine (PEPCID)  If needed: fexofenadine (ALLERGY ),  nasal spray Stop taking vitamins, fish oil and herbal medications. Do not take any NSAIDs ie: Ibuprofen, Advil, Naproxen, BC and Goody Powder; stop Friday, March 06, 2016.    How to Manage Your Diabetes Before and After Surgery  Why is it important to control my blood sugar before and after surgery? . Improving blood sugar levels before and after surgery helps healing and can limit problems. . A way of improving blood sugar control is eating a healthy diet by: o  Eating less sugar and carbohydrates o  Increasing activity/exercise o  Talking with your doctor about reaching your blood sugar goals . High blood sugars (greater than 180 mg/dL) can raise your risk of infections and slow your recovery, so you will need to focus on controlling your diabetes during the weeks before surgery. . Make sure that the doctor who takes care of your diabetes knows about your planned surgery including the date and location.  How do I manage my blood sugar before surgery? . Check your blood sugar at least 4 times a day, starting 2 days before surgery, to make sure that the level is not too high or low. o Check your blood sugar the morning of your surgery when you wake up and every 2 hours until you get to the Short Stay unit. . If your blood sugar is less than 70 mg/dL, you will need to  treat for low blood sugar: o Do not take insulin. o Treat a low blood sugar (less than 70 mg/dL) with  cup of clear juice (cranberry or apple), 4 glucose tablets, OR glucose gel. o Recheck blood sugar in 15 minutes after treatment (to make sure it is greater than 70 mg/dL). If your blood sugar is not greater than 70 mg/dL on recheck, call [phone] for further instructions. . Report your blood sugar to the short stay nurse when you get to Short Stay.  . If you are admitted to the hospital after surgery: o Your blood sugar will be checked by the staff and you will probably be given insulin after surgery (instead of oral diabetes medicines) to make sure you have good blood sugar levels. o The goal for blood sugar control after surgery is 80-180 mg/dL.  WHAT DO I DO ABOUT MY DIABETES MEDICATION?   Marland Kitchen Do not take oral diabetes medicines (pills) the morning of surgery such as metFORMIN (GLUCOPHAGE-XR)   Patient Signature:  Date:   Nurse Signature:  Date:   Reviewed and Endorsed by [facility] Patient Education Committee, August 2015  Do not wear jewelry, make-up or nail polish.  Do not wear lotions, powders, or perfumes, or deoderant.  Do not shave 48 hours prior to surgery.  Men may shave face and neck.  Do not bring valuables to the hospital.  [facility] is not responsible for  any belongings or valuables.  Contacts, dentures or bridgework may not be worn into surgery.  Leave your suitcase in the car.  After surgery it may be brought to your room.  For patients admitted to the hospital, discharge time will be determined by your treatment team.  Patients discharged the day of surgery will not be allowed to drive home.   Name and phone number of your driver:   Special instructions: Shower the night before surgery and the morning of surgery with CHG.  Please read over the following fact sheets that you were given. Pain Booklet, Coughing and Deep Breathing and Surgical Site Infection  Prevention

## 2016-03-05 NOTE — Progress Notes (Signed)
Anesthesia Chart Review: Patient is a 70 year old male scheduled for laparoscopic umbilical hernia repair on 03/13/16 by Dr. Rosendo Gros.  History includes CAD s/p CABG '02 (Dr. Roxy Manns), DM2, OSA (no CPAP), GERD, fatty liver, hearing loss, tonsillectomy, left total shoulder '12, cholecystectomy.   PCP is Dr. Jerene Bears with [facility]. Patient denied seeing a cardiologist in many years. The last encounter noted was with Dr. Salli Real in 03/2004. Patient's previous cardiology office notes and CABG op report are not viewable in [facility].   Meds include Uroxatral, aspirin 81 mg (on hold), atenolol, Astelin nasal spray, Pepcid, fexofenadine, Flonase, Lopid, Atrovent, metformin, Relafen.  BP 121/70   Pulse 63   Temp 36.6 C   Resp 20   Ht 5' 9.5" (1.765 m)   Wt 198 lb 4.8 oz (89.9 kg)   SpO2 97%   BMI 28.86 kg/m   03/05/16 EKG SB at 56 bpm, ST and marked T wave abnormality, consider anterolateral ischemia. T wave abnormality is new when compared to 06/03/10 tracing from Dr. Woody Seller' office (scanned under Media tab).   09/13/08 Nuclear stress test ([facility]; scanned under Media tab): Impression: Abnormal exercise EKG due to 1 mm of downsloping ST segment depression in the inferolateral leads. There was normal myocardial perfusion and normal myocardial function. There was no evidence of significant ischemia noted. Calculated EF 69%.  Preoperative labs CMET WNL except glucose of 137 (non-fasting). CBC WNL.  Patient had already left PAT when I went to evaluate him due to new EKG findings since 2012. Per his PAT RN, he denied CP and SOB.  I called Dr. Woody Seller and spoke with him about EKG findings. The last EKG done at his office was in 2012. He agrees that patient should be reevaluated by cardiology since known CAD and new EKG changes. I have notified triage RN Abigail Butts at [facility] that patient will need preoperative cardiology evaluation prior to surgery.  George Hugh [facility] Short Stay  Center/Anesthesiology Phone [phone] 03/05/2016 12:24 PM

## 2016-03-13 ENCOUNTER — Ambulatory Visit (HOSPITAL_COMMUNITY): Admission: RE | Admit: 2016-03-13 | Payer: Medicare Other | Source: Ambulatory Visit | Admitting: General Surgery

## 2016-03-13 ENCOUNTER — Encounter (HOSPITAL_COMMUNITY): Admission: RE | Payer: Self-pay | Source: Ambulatory Visit

## 2016-03-13 SURGERY — REPAIR, HERNIA, UMBILICAL, LAPAROSCOPIC
Anesthesia: General

## 2016-03-18 DIAGNOSIS — J069 Acute upper respiratory infection, unspecified: Secondary | ICD-10-CM | POA: Diagnosis not present

## 2016-03-18 DIAGNOSIS — J309 Allergic rhinitis, unspecified: Secondary | ICD-10-CM | POA: Diagnosis not present

## 2016-03-18 DIAGNOSIS — I10 Essential (primary) hypertension: Secondary | ICD-10-CM | POA: Diagnosis not present

## 2016-03-18 DIAGNOSIS — R05 Cough: Secondary | ICD-10-CM | POA: Diagnosis not present

## 2016-03-23 DIAGNOSIS — E7800 Pure hypercholesterolemia, unspecified: Secondary | ICD-10-CM | POA: Diagnosis not present

## 2016-03-23 DIAGNOSIS — E119 Type 2 diabetes mellitus without complications: Secondary | ICD-10-CM | POA: Diagnosis not present

## 2016-03-23 DIAGNOSIS — I10 Essential (primary) hypertension: Secondary | ICD-10-CM | POA: Diagnosis not present

## 2016-03-23 DIAGNOSIS — I2510 Atherosclerotic heart disease of native coronary artery without angina pectoris: Secondary | ICD-10-CM | POA: Diagnosis not present

## 2016-03-24 ENCOUNTER — Encounter: Payer: Self-pay | Admitting: Cardiology

## 2016-03-24 NOTE — Progress Notes (Signed)
Cardiology Office Note  Date: 03/25/2016   ID: Joseph Burton, DOB [birth year], MRN [id number]  PCP: Joseph Chroman, MD  Consulting Cardiologist: Joseph Lesches, MD   Chief Complaint  Patient presents with  . Preoperative evaluation  . Coronary Artery Disease    History of Present Illness: Joseph Burton is a 70 y.o. male referred for cardiology consultation by Dr. Woody Burton. Patient is being considered for laparoscopic umbilical hernia repair by Joseph Burton with [facility].   I do not have complete information regarding the patient's previous cardiac history. He underwent CABG with Dr. Roxy Burton back in 2002, looks like he followed with Dr. Dannielle Burton for a period of time after that, although I am not able to pull any old office notes. He does report occasional angina symptoms, no nitroglycerin use however. I reviewed his recent ECG with repeat tracing today showing ST-T wave abnormalities in the inferior and anterolateral leads. I do not have an old tracing for comparison.  He has not undergone any interval ischemic evaluation or assessment of LVEF. He does report compliance with his medications, cardiac regimen including aspirin, atenolol, and gemfibrozil.  Past Medical History:  Diagnosis Date  . Arthritis   . Coronary artery disease    Multivessel status post CABG 2002  . Early cataracts, bilateral   . Environmental and seasonal allergies   . Fatty liver   . GERD (gastroesophageal reflux disease)   . Hearing loss   . Sleep apnea   . Type 2 diabetes mellitus ([place])   . Umbilical hernia     Past Surgical History:  Procedure Laterality Date  . CHOLECYSTECTOMY     3 years ago  . COLONOSCOPY N/A 10/02/2015   Procedure: COLONOSCOPY;  Surgeon: Joseph Houston, MD;  Location: [facility];  Service: Endoscopy;  Laterality: N/A;  930  . CORONARY ARTERY BYPASS GRAFT  2002  . JOINT REPLACEMENT     Left shoulder  . SHOULDER ARTHROSCOPY W/ ROTATOR CUFF REPAIR Left  07/2010  . TONSILLECTOMY  1969    Current Outpatient Prescriptions  Medication Sig Dispense Refill  . alfuzosin (UROXATRAL) 10 MG 24 hr tablet Take 10 mg by mouth at bedtime.     Marland Kitchen aspirin EC 81 MG tablet Take 81 mg by mouth daily.    Marland Kitchen atenolol (TENORMIN) 50 MG tablet Take 25 mg by mouth at bedtime.   9  . azelastine (ASTELIN) 0.1 % nasal spray Place 1 spray into both nostrils 2 (two) times daily as needed for rhinitis. Use in each nostril as directed    . famotidine (PEPCID) 40 MG tablet Take 40 mg by mouth daily.     . fexofenadine (ALLERGY RELIEF) 180 MG tablet Take 180 mg by mouth daily as needed for allergies or rhinitis.    . fluticasone (FLONASE) 50 MCG/ACT nasal spray Place 2 sprays into both nostrils daily as needed for allergies or rhinitis.    Marland Kitchen gemfibrozil (LOPID) 600 MG tablet Take 600 mg by mouth at bedtime.     Marland Kitchen ipratropium (ATROVENT) 0.06 % nasal spray Place 2 sprays into both nostrils 3 (three) times daily as needed for rhinitis.    . metFORMIN (GLUCOPHAGE-XR) 500 MG 24 hr tablet Take 500 mg by mouth daily with breakfast.     . nabumetone (RELAFEN) 750 MG tablet Take 750 mg by mouth 2 (two) times daily.      No current facility-administered medications for this visit.    Allergies:  Patient  has no known allergies.   Social History: The patient  reports that he has never smoked. He has never used smokeless tobacco. He reports that he does not drink alcohol or use drugs.   Family History: The patient's family history includes Alzheimer's disease in his mother; Colon cancer in his father; Heart disease in his brother, father, and sister; Hypertension in his father; Melanoma in his father; Prostate cancer in his father.   ROS:  Please see the history of present illness. Otherwise, complete review of systems is positive for arthritic pains. All other systems are reviewed and negative.   Physical Exam: VS:  BP 128/84   Pulse 65   Ht 5\' 9"  (1.753 m)   Wt 190 lb (86.2 kg)    SpO2 98%   BMI 28.06 kg/m , BMI Body mass index is 28.06 kg/m.  Wt Readings from Last 3 Encounters:  03/25/16 190 lb (86.2 kg)  03/05/16 198 lb 4.8 oz (89.9 kg)  10/02/15 205 lb (93 kg)    General: Patient appears comfortable at rest. HEENT: Conjunctiva and lids normal, oropharynx clear. Neck: Supple, no elevated JVP or carotid bruits, no thyromegaly. Lungs: Clear to auscultation, nonlabored breathing at rest. Cardiac: Regular rate and rhythm, no S3, soft systolic murmur, no pericardial rub. Abdomen: Soft, nontender, bowel sounds present, no guarding or rebound. Extremities: No pitting edema, distal pulses 2+. Skin: Warm and dry. Musculoskeletal: No kyphosis. Neuropsychiatric: Alert and oriented x3, affect grossly appropriate.  ECG: I personally reviewed the tracing from 03/05/2016 which showed sinus bradycardia with anterolateral ST-T wave abnormalities including deep T wave inversions. There is no old tracing for comparison.  Recent Labwork: 03/05/2016: ALT 24; AST 21; BUN 13; Creatinine, Ser 0.90; Hemoglobin 15.8; Platelets 193; Potassium 4.1; Sodium 139   Assessment and Plan:  1. Preoperative evaluation in a 70 year old male with history of CAD status post CABG in 2002, no regular cardiology follow-up for years, recent screening ECG abnormal, and intermittent angina symptoms. He reports compliance with current medical regimen. Plan is to pursue an echocardiogram as well as [place] for assessment of cardiac structure and function as well as ischemic burden. Further recommendations to follow.  2. Type 2 diabetes mellitus, on Glucophage and followed by Dr. Woody Burton.  Current medicines were reviewed with the patient today.   Orders Placed This Encounter  Procedures  . NM Myocar Multi W/Spect W/Wall Motion / EF  . Myocardial Perfusion Imaging  . EKG 12-Lead  . ECHOCARDIOGRAM COMPLETE    Disposition: Call with test results and further  recommendations.  Signed, Joseph Sark, MD, [facility] 03/25/2016 1:50 PM    Joseph Burton at [place], [place], [place] [address] Phone: [phone]; Fax: [phone]

## 2016-03-25 ENCOUNTER — Ambulatory Visit (INDEPENDENT_AMBULATORY_CARE_PROVIDER_SITE_OTHER): Payer: Medicare Other | Admitting: Cardiology

## 2016-03-25 ENCOUNTER — Encounter: Payer: Self-pay | Admitting: *Deleted

## 2016-03-25 ENCOUNTER — Encounter: Payer: Self-pay | Admitting: Cardiology

## 2016-03-25 VITALS — BP 128/84 | HR 65 | Ht 69.0 in | Wt 190.0 lb

## 2016-03-25 DIAGNOSIS — I25119 Atherosclerotic heart disease of native coronary artery with unspecified angina pectoris: Secondary | ICD-10-CM

## 2016-03-25 DIAGNOSIS — E1159 Type 2 diabetes mellitus with other circulatory complications: Secondary | ICD-10-CM

## 2016-03-25 DIAGNOSIS — I209 Angina pectoris, unspecified: Secondary | ICD-10-CM | POA: Diagnosis not present

## 2016-03-25 DIAGNOSIS — Z01810 Encounter for preprocedural cardiovascular examination: Secondary | ICD-10-CM | POA: Diagnosis not present

## 2016-03-25 DIAGNOSIS — R0602 Shortness of breath: Secondary | ICD-10-CM | POA: Diagnosis not present

## 2016-03-25 DIAGNOSIS — Z79899 Other long term (current) drug therapy: Secondary | ICD-10-CM | POA: Diagnosis not present

## 2016-03-25 DIAGNOSIS — R5383 Other fatigue: Secondary | ICD-10-CM | POA: Diagnosis not present

## 2016-03-25 NOTE — Patient Instructions (Signed)
Your physician recommends that you schedule a follow-up appointment in: TO BE DETERMINED AFTER TESTING  Your physician recommends that you continue on your current medications as directed. Please refer to the Current Medication list given to you today.  Your physician has requested that you have an echocardiogram. Echocardiography is a painless test that uses sound waves to create images of your heart. It provides your doctor with information about the size and shape of your heart and how well your heart's chambers and valves are working. This procedure takes approximately one hour. There are no restrictions for this procedure.  Your physician has requested that you have a lexiscan myoview. For further information please visit [email or web address]. Please follow instruction sheet, as given.  Thank you for choosing [facility]!!

## 2016-03-30 DIAGNOSIS — Z6830 Body mass index (BMI) 30.0-30.9, adult: Secondary | ICD-10-CM | POA: Diagnosis not present

## 2016-03-30 DIAGNOSIS — Z0000 Encounter for general adult medical examination without abnormal findings: Secondary | ICD-10-CM | POA: Diagnosis not present

## 2016-03-30 DIAGNOSIS — Z1211 Encounter for screening for malignant neoplasm of colon: Secondary | ICD-10-CM | POA: Diagnosis not present

## 2016-03-30 DIAGNOSIS — Z299 Encounter for prophylactic measures, unspecified: Secondary | ICD-10-CM | POA: Diagnosis not present

## 2016-03-30 DIAGNOSIS — Z1389 Encounter for screening for other disorder: Secondary | ICD-10-CM | POA: Diagnosis not present

## 2016-03-30 DIAGNOSIS — Z7189 Other specified counseling: Secondary | ICD-10-CM | POA: Diagnosis not present

## 2016-04-02 ENCOUNTER — Encounter (HOSPITAL_COMMUNITY)
Admission: RE | Admit: 2016-04-02 | Discharge: 2016-04-02 | Disposition: A | Discharge status: Home / Self Care | Payer: Medicare Other | Source: Ambulatory Visit | Attending: Cardiology | Admitting: Cardiology

## 2016-04-02 ENCOUNTER — Inpatient Hospital Stay (HOSPITAL_COMMUNITY): Admission: RE | Admit: 2016-04-02 | Payer: Medicare Other | Source: Ambulatory Visit

## 2016-04-02 ENCOUNTER — Encounter (HOSPITAL_COMMUNITY): Payer: Self-pay

## 2016-04-02 ENCOUNTER — Ambulatory Visit (HOSPITAL_COMMUNITY)
Admission: RE | Admit: 2016-04-02 | Discharge: 2016-04-02 | Disposition: A | Discharge status: Home / Self Care | Payer: Medicare Other | Source: Ambulatory Visit | Attending: Cardiology | Admitting: Cardiology

## 2016-04-02 DIAGNOSIS — I25119 Atherosclerotic heart disease of native coronary artery with unspecified angina pectoris: Secondary | ICD-10-CM | POA: Diagnosis not present

## 2016-04-02 DIAGNOSIS — I081 Rheumatic disorders of both mitral and tricuspid valves: Secondary | ICD-10-CM | POA: Diagnosis not present

## 2016-04-02 DIAGNOSIS — I5030 Unspecified diastolic (congestive) heart failure: Secondary | ICD-10-CM | POA: Insufficient documentation

## 2016-04-02 HISTORY — DX: Essential (primary) hypertension: I10

## 2016-04-02 LAB — NM MYOCAR MULTI W/SPECT W/WALL MOTION / EF
CHL CUP NUCLEAR SDS: 0
CHL CUP NUCLEAR SRS: 3
CHL CUP NUCLEAR SSS: 3
CHL CUP RESTING HR STRESS: 56 {beats}/min
LHR: 0.34
LV dias vol: 70 mL (ref 62–150)
LV sys vol: 18 mL
NUC STRESS TID: 1.42
Peak HR: 80 {beats}/min

## 2016-04-02 MED ORDER — SODIUM CHLORIDE 0.9% FLUSH
INTRAVENOUS | Status: AC
  Administered 2016-04-02: 10 mL via INTRAVENOUS
  Filled 2016-04-02: qty 10

## 2016-04-02 MED ORDER — REGADENOSON 0.4 MG/5ML IV SOLN
INTRAVENOUS | Status: AC
  Administered 2016-04-02: 0.4 mg via INTRAVENOUS
  Filled 2016-04-02: qty 5

## 2016-04-02 MED ORDER — TECHNETIUM TC 99M TETROFOSMIN IV KIT
10.0000 | PACK | Freq: Once | INTRAVENOUS | Status: AC | PRN
  Administered 2016-04-02: 11 via INTRAVENOUS

## 2016-04-02 MED ORDER — TECHNETIUM TC 99M TETROFOSMIN IV KIT
30.0000 | PACK | Freq: Once | INTRAVENOUS | Status: AC | PRN
  Administered 2016-04-02: 32 via INTRAVENOUS

## 2016-04-02 NOTE — Progress Notes (Signed)
*  PRELIMINARY RESULTS* Echocardiogram 2D Echocardiogram has been performed.  Samuel Germany 04/02/2016, 10:34 AM

## 2016-04-07 ENCOUNTER — Telehealth: Payer: Self-pay | Admitting: *Deleted

## 2016-04-07 NOTE — Telephone Encounter (Signed)
Notes Recorded by Satira Sark, MD on 04/02/2016 at 4:57 PM EST Results reviewed. LVEF is normal range, similar to Myoview study. Overall reassuring without any major valvular abnormalities. A copy of this test should be forwarded to Glenda Chroman, MD.   Pt aware - routed to Dr. Rosendo Gros and pcp

## 2016-04-07 NOTE — Telephone Encounter (Signed)
-----   Message from Massie Maroon, [place] sent at 04/07/2016  8:11 AM EST -----   ----- Message ----- From: Satira Sark, MD Sent: 04/02/2016   4:53 PM To: Ralene Ok, MD, Merlene Laughter, LPN  Results reviewed. Low risk stress test without any significant ischemic defects and normal LVEF. In the setting of symptomatically stable CAD on medical therapy, he should be able to proceed with planned surgery at overall low to intermediate perioperative cardiac risk. Please forward results to patient's surgeon Dr. Rosendo Gros. A copy of this test should be forwarded to Glenda Chroman, MD.

## 2016-04-17 DIAGNOSIS — E1165 Type 2 diabetes mellitus with hyperglycemia: Secondary | ICD-10-CM | POA: Diagnosis not present

## 2016-04-17 DIAGNOSIS — I10 Essential (primary) hypertension: Secondary | ICD-10-CM | POA: Diagnosis not present

## 2016-04-17 DIAGNOSIS — Z299 Encounter for prophylactic measures, unspecified: Secondary | ICD-10-CM | POA: Diagnosis not present

## 2016-04-17 DIAGNOSIS — Z6830 Body mass index (BMI) 30.0-30.9, adult: Secondary | ICD-10-CM | POA: Diagnosis not present

## 2016-04-17 DIAGNOSIS — Z713 Dietary counseling and surveillance: Secondary | ICD-10-CM | POA: Diagnosis not present

## 2016-04-22 ENCOUNTER — Other Ambulatory Visit: Payer: Self-pay | Admitting: General Surgery

## 2016-04-22 ENCOUNTER — Ambulatory Visit: Payer: Self-pay | Admitting: General Surgery

## 2016-05-25 ENCOUNTER — Encounter (HOSPITAL_COMMUNITY)
Admission: RE | Admit: 2016-05-25 | Discharge: 2016-05-25 | Disposition: A | Discharge status: Home / Self Care | Payer: Medicare Other | Source: Ambulatory Visit | Attending: General Surgery | Admitting: General Surgery

## 2016-05-25 ENCOUNTER — Encounter (HOSPITAL_COMMUNITY): Payer: Self-pay

## 2016-05-25 DIAGNOSIS — Z01812 Encounter for preprocedural laboratory examination: Secondary | ICD-10-CM | POA: Insufficient documentation

## 2016-05-25 DIAGNOSIS — K429 Umbilical hernia without obstruction or gangrene: Secondary | ICD-10-CM | POA: Diagnosis not present

## 2016-05-25 LAB — CBC
HCT: 41.3 % (ref 39.0–52.0)
HEMOGLOBIN: 14.4 g/dL (ref 13.0–17.0)
MCH: 31.9 pg (ref 26.0–34.0)
MCHC: 34.9 g/dL (ref 30.0–36.0)
MCV: 91.6 fL (ref 78.0–100.0)
PLATELETS: 167 10*3/uL (ref 150–400)
RBC: 4.51 MIL/uL (ref 4.22–5.81)
RDW: 12.9 % (ref 11.5–15.5)
WBC: 4.4 10*3/uL (ref 4.0–10.5)

## 2016-05-25 LAB — COMPREHENSIVE METABOLIC PANEL
ALBUMIN: 4.2 g/dL (ref 3.5–5.0)
ALT: 25 U/L (ref 17–63)
AST: 22 U/L (ref 15–41)
Alkaline Phosphatase: 57 U/L (ref 38–126)
Anion gap: 7 (ref 5–15)
BUN: 14 mg/dL (ref 6–20)
CALCIUM: 9.9 mg/dL (ref 8.9–10.3)
CHLORIDE: 111 mmol/L (ref 101–111)
CO2: 23 mmol/L (ref 22–32)
Creatinine, Ser: 0.83 mg/dL (ref 0.61–1.24)
GFR calc Af Amer: >60 mL/min (ref >60)
GFR calc non Af Amer: >60 mL/min (ref >60)
GLUCOSE: 183 mg/dL — AB (ref 65–99)
Potassium: 4.5 mmol/L (ref 3.5–5.1)
SODIUM: 141 mmol/L (ref 135–145)
Total Bilirubin: 0.9 mg/dL (ref 0.3–1.2)
Total Protein: 6.9 g/dL (ref 6.5–8.1)

## 2016-05-25 LAB — GLUCOSE, CAPILLARY: Glucose-Capillary: 161 mg/dL — ABNORMAL HIGH (ref 65–99)

## 2016-05-25 NOTE — Progress Notes (Signed)
Patient states he was here earlier to have same surgery.  Was cancelled and needed cardiac w/u since he had a CABG in 2002, last saw a cardiologist in 2005.  Was send to see Dr. Domenic Polite in Nov. 2017  Echo & stress were done.  Clearance note in chart.  PCP is Dr. Woody Seller.  LOV was Dev. 2017 - patient states he had A1C in Dec.2017.  I have checked results and not able to see them.  I have called the office [phone] for a copy of LOV & labs. Patient only checks his blood sugar 2 -3 x a week and usually runs 100-130. Was also tested yrs ago for OSA.  Pt unable to wear the mask, so no longer does.  "sleeps fine without it"

## 2016-05-25 NOTE — Pre-Procedure Instructions (Addendum)
Joseph Burton  05/25/2016      [facility] [address] [place] [address] Phone: [phone] Fax: [phone]    Your procedure is scheduled on Friday, January 12th .  Report to [facility] Admitting at 10:15 AM   Call this number if you have problems the MORNING of surgery:  [phone]   Remember:  Do not eat food or drink liquids after midnight Thursday.   Take these medicines the morning of surgery with A SIP OF WATER : Atenolol, Famotidine.             Please use your nasal sprays that morning.                        4-5 days prior to surgery,  STOP taking any Vitamins, Herbal Supplements, Anti-inflammatories.   Do not wear jewelry - no rings or watches.  Do not wear lotions, colognes or deoderant.              Men may shave face and neck.  Do not bring valuables to the hospital.  [facility] is not responsible for any belongings or valuables.  Contacts, dentures or bridgework may not be worn into surgery.  Leave your suitcase in the car.  After surgery it may be brought to your room. For patients admitted to the hospital, discharge time will be determined by your treatment team.  If you do go home, you will need a responsible person to stay with you for the first 24 hrs.  Please read over the following fact sheets that you were given. Pain Booklet and Surgical Site Infection Prevention              How to Manage Your Diabetes Before and After Surgery  Why is it important to control my blood sugar before and after surgery? . Improving blood sugar levels before and after surgery helps healing and can limit problems. . A way of improving blood sugar control is eating a healthy diet by: o  Eating less sugar and carbohydrates o  Increasing activity/exercise o  Talking with your doctor about reaching your blood sugar goals o  . High blood sugars (greater than 180 mg/dL) can raise your risk of  infections and slow your recovery, so you will need to focus on controlling your diabetes during the weeks before surgery. .  . Make sure that the doctor who takes care of your diabetes knows about your planned surgery including the date and location.  How do I manage my blood sugar before surgery? . Check your blood sugar at least 4 times a day, starting 2 days before surgery, to make sure that the level is not too high or low. o Check your blood sugar the morning of your surgery when you wake up and every 2 hours until you get to the Short Stay unit. o  . If your blood sugar is less than 70 mg/dL, you will need to treat for low blood sugar: o Do not take insulin. o Treat a low blood sugar (less than 70 mg/dL) with  cup of clear juice (cranberry or apple), 4 glucose tablets, OR glucose gel. o  o Recheck blood sugar in 15 minutes after treatment (to make sure it is greater than 70 mg/dL). If your blood sugar is not greater than 70 mg/dL on recheck, call [phone] for further instructions. . Report  your blood sugar to the short stay nurse when you get to Short Stay.  . If you are admitted to the hospital after surgery: o Your blood sugar will be checked by the staff and you will probably be given insulin after surgery (instead of oral diabetes medicines) to make sure you have good blood sugar levels. o The goal for blood sugar control after surgery is 80-180 mg/dL.         DO NOT take any oral diabetes medications (pills) the morning of surgery.            Patient Signature:  Date:   Nurse Signature:  Date:   Reviewed and Endorsed by [facility] Patient Education Committee, August 2015

## 2016-05-26 NOTE — Progress Notes (Signed)
Anesthesia follow-up: Patient is a 71 year old male scheduled for laparoscopic umbilical hernia repair with mesh on 05/29/16 by Dr. Rosendo Gros. Procedure was initially scheduled for 03/07/16, but was postponed due to need for cardiology evaluation for new EKG ST/T abnormality with known CAD history (CABG '02). See my note from 03/05/16 for additional details including PMHx. Since then patient was evaluated by Dr. Rozann Lesches on 03/25/16. Following a recent stress and echo he wrote, "Low risk stress test without any significant ischemic defects and normal LVEF. In the setting of symptomatically stable CAD on medical therapy, he should be able to proceed with planned surgery at overall low to intermediate perioperative cardiac risk."  BP 130/61   Pulse 65   Temp 36.4 C   Resp 20   Ht 5\' 9"  (1.753 m)   Wt 204 lb 14.4 oz (92.9 kg)   SpO2 94%   BMI 30.26 kg/m   Nuclear stress test 04/02/16:  No diagnostic ST segment changes to indicate ischemia.  Small, mild intensity, apical anterior defect that is most consistent with attenuation artifact. No large, reversible perfusion defects to indicate ischemic territories.  This is a low risk study.  Nuclear stress EF: 70%.  Echo 04/02/16: Study Conclusions - Left ventricle: The cavity size was normal. Wall thickness was   increased in a pattern of mild LVH. Systolic function was   vigorous. The estimated ejection fraction was in the range of 65%   to 70%. Wall motion was normal; there were no regional wall   motion abnormalities. Features are consistent with a pseudonormal   left ventricular filling pattern, with concomitant abnormal   relaxation and increased filling pressure (grade 2 diastolic   dysfunction). - Mitral valve: There was trivial regurgitation. - Right atrium: Central venous pressure (est): 3 mm Hg. - Tricuspid valve: There was mild regurgitation. - Pulmonary arteries: PA peak pressure: 33 mm Hg (S). - Pericardium, extracardiac:  There was no pericardial effusion. Impressions: - Mild LVH with LVEF 65-70%. Grade 2 diastolic dysfunction. Trivial   mitral regurgitation. Mild tricuspid regurgitation with PASP 33   mmHg.  Preoperative labs noted. A1c was 6.5 on 04/17/16 at [place].   Patient with recent cardiac testing and clearance. Labs acceptable. If no acute changes then I would anticipate that he can proceed as planned.  George Hugh [facility] Short Stay Center/Anesthesiology Phone [phone] 05/26/2016 3:56 PM

## 2016-05-29 ENCOUNTER — Ambulatory Visit (HOSPITAL_COMMUNITY): Payer: Medicare Other | Admitting: Certified Registered"

## 2016-05-29 ENCOUNTER — Encounter (HOSPITAL_COMMUNITY)
Admission: RE | Disposition: A | Discharge status: Home / Self Care | Payer: Self-pay | Source: Ambulatory Visit | Attending: General Surgery

## 2016-05-29 ENCOUNTER — Encounter (HOSPITAL_COMMUNITY): Payer: Self-pay | Admitting: *Deleted

## 2016-05-29 ENCOUNTER — Ambulatory Visit (HOSPITAL_COMMUNITY): Payer: Medicare Other | Admitting: Emergency Medicine

## 2016-05-29 ENCOUNTER — Ambulatory Visit (HOSPITAL_COMMUNITY)
Admission: RE | Admit: 2016-05-29 | Discharge: 2016-05-29 | Disposition: A | Discharge status: Home / Self Care | Payer: Medicare Other | Source: Ambulatory Visit | Attending: General Surgery | Admitting: General Surgery

## 2016-05-29 DIAGNOSIS — Z951 Presence of aortocoronary bypass graft: Secondary | ICD-10-CM | POA: Diagnosis not present

## 2016-05-29 DIAGNOSIS — Z8042 Family history of malignant neoplasm of prostate: Secondary | ICD-10-CM | POA: Insufficient documentation

## 2016-05-29 DIAGNOSIS — Z79899 Other long term (current) drug therapy: Secondary | ICD-10-CM | POA: Insufficient documentation

## 2016-05-29 DIAGNOSIS — Z800 Family history of malignant neoplasm of digestive organs: Secondary | ICD-10-CM | POA: Insufficient documentation

## 2016-05-29 DIAGNOSIS — M549 Dorsalgia, unspecified: Secondary | ICD-10-CM | POA: Diagnosis not present

## 2016-05-29 DIAGNOSIS — Z7982 Long term (current) use of aspirin: Secondary | ICD-10-CM | POA: Diagnosis not present

## 2016-05-29 DIAGNOSIS — M1990 Unspecified osteoarthritis, unspecified site: Secondary | ICD-10-CM | POA: Diagnosis not present

## 2016-05-29 DIAGNOSIS — K429 Umbilical hernia without obstruction or gangrene: Secondary | ICD-10-CM | POA: Insufficient documentation

## 2016-05-29 DIAGNOSIS — Z808 Family history of malignant neoplasm of other organs or systems: Secondary | ICD-10-CM | POA: Diagnosis not present

## 2016-05-29 DIAGNOSIS — K420 Umbilical hernia with obstruction, without gangrene: Secondary | ICD-10-CM | POA: Diagnosis not present

## 2016-05-29 DIAGNOSIS — K219 Gastro-esophageal reflux disease without esophagitis: Secondary | ICD-10-CM | POA: Insufficient documentation

## 2016-05-29 DIAGNOSIS — Z86010 Personal history of colonic polyps: Secondary | ICD-10-CM | POA: Insufficient documentation

## 2016-05-29 DIAGNOSIS — E119 Type 2 diabetes mellitus without complications: Secondary | ICD-10-CM | POA: Insufficient documentation

## 2016-05-29 DIAGNOSIS — E7800 Pure hypercholesterolemia, unspecified: Secondary | ICD-10-CM | POA: Insufficient documentation

## 2016-05-29 DIAGNOSIS — Z9049 Acquired absence of other specified parts of digestive tract: Secondary | ICD-10-CM | POA: Diagnosis not present

## 2016-05-29 DIAGNOSIS — Z8249 Family history of ischemic heart disease and other diseases of the circulatory system: Secondary | ICD-10-CM | POA: Diagnosis not present

## 2016-05-29 HISTORY — PX: UMBILICAL HERNIA REPAIR: SHX196

## 2016-05-29 HISTORY — PX: INSERTION OF MESH: SHX5868

## 2016-05-29 LAB — GLUCOSE, CAPILLARY
GLUCOSE-CAPILLARY: 134 mg/dL — AB (ref 65–99)
Glucose-Capillary: 130 mg/dL — ABNORMAL HIGH (ref 65–99)

## 2016-05-29 SURGERY — REPAIR, HERNIA, UMBILICAL, LAPAROSCOPIC
Anesthesia: General | Site: Abdomen

## 2016-05-29 MED ORDER — SUGAMMADEX SODIUM 500 MG/5ML IV SOLN
INTRAVENOUS | Status: DC | PRN
  Administered 2016-05-29: 500 mg via INTRAVENOUS

## 2016-05-29 MED ORDER — MEPERIDINE HCL 25 MG/ML IJ SOLN: 6.2500 mg | INTRAMUSCULAR | Status: DC | PRN

## 2016-05-29 MED ORDER — 0.9 % SODIUM CHLORIDE (POUR BTL) OPTIME
TOPICAL | Status: DC | PRN
  Administered 2016-05-29: 1000 mL

## 2016-05-29 MED ORDER — FENTANYL CITRATE (PF) 100 MCG/2ML IJ SOLN
INTRAMUSCULAR | Status: DC | PRN
  Administered 2016-05-29 (×2): 100 ug via INTRAVENOUS

## 2016-05-29 MED ORDER — PROPOFOL 10 MG/ML IV BOLUS
INTRAVENOUS | Status: AC
  Filled 2016-05-29: qty 20

## 2016-05-29 MED ORDER — PROMETHAZINE HCL 25 MG/ML IJ SOLN: 6.2500 mg | INTRAMUSCULAR | Status: DC | PRN

## 2016-05-29 MED ORDER — SODIUM CHLORIDE 0.9 % IV SOLN: 250.0000 mL | INTRAVENOUS | Status: DC | PRN

## 2016-05-29 MED ORDER — ACETAMINOPHEN 160 MG/5ML PO SOLN: 325.0000 mg | ORAL | Status: DC | PRN

## 2016-05-29 MED ORDER — SODIUM CHLORIDE 0.9% FLUSH: 3.0000 mL | INTRAVENOUS | Status: DC | PRN

## 2016-05-29 MED ORDER — PROPOFOL 10 MG/ML IV BOLUS
INTRAVENOUS | Status: DC | PRN
  Administered 2016-05-29: 160 mg via INTRAVENOUS

## 2016-05-29 MED ORDER — ACETAMINOPHEN 325 MG PO TABS: 325.0000 mg | ORAL_TABLET | ORAL | Status: DC | PRN

## 2016-05-29 MED ORDER — KETOROLAC TROMETHAMINE 30 MG/ML IJ SOLN
30.0000 mg | Freq: Once | INTRAMUSCULAR | Status: AC
  Administered 2016-05-29: 30 mg via INTRAVENOUS

## 2016-05-29 MED ORDER — BUPIVACAINE HCL (PF) 0.25 % IJ SOLN
INTRAMUSCULAR | Status: AC
  Filled 2016-05-29: qty 30

## 2016-05-29 MED ORDER — CHLORHEXIDINE GLUCONATE CLOTH 2 % EX PADS: 6.0000 | MEDICATED_PAD | Freq: Once | CUTANEOUS | Status: DC

## 2016-05-29 MED ORDER — KETOROLAC TROMETHAMINE 30 MG/ML IJ SOLN
INTRAMUSCULAR | Status: AC
  Administered 2016-05-29: 30 mg
  Filled 2016-05-29: qty 1

## 2016-05-29 MED ORDER — CEFAZOLIN SODIUM-DEXTROSE 2-4 GM/100ML-% IV SOLN
2.0000 g | INTRAVENOUS | Status: AC
  Administered 2016-05-29: 2 g via INTRAVENOUS
  Filled 2016-05-29: qty 100

## 2016-05-29 MED ORDER — SODIUM CHLORIDE 0.9% FLUSH: 3.0000 mL | Freq: Two times a day (BID) | INTRAVENOUS | Status: DC

## 2016-05-29 MED ORDER — BUPIVACAINE HCL 0.25 % IJ SOLN
INTRAMUSCULAR | Status: DC | PRN
  Administered 2016-05-29: 6 mL

## 2016-05-29 MED ORDER — HYDROMORPHONE HCL 1 MG/ML IJ SOLN: 0.2500 mg | INTRAMUSCULAR | Status: DC | PRN

## 2016-05-29 MED ORDER — ONDANSETRON HCL 4 MG/2ML IJ SOLN
INTRAMUSCULAR | Status: DC | PRN
  Administered 2016-05-29: 4 mg via INTRAVENOUS

## 2016-05-29 MED ORDER — OXYCODONE HCL 5 MG PO TABS: 5.0000 mg | ORAL_TABLET | ORAL | Status: DC | PRN

## 2016-05-29 MED ORDER — OXYCODONE-ACETAMINOPHEN 5-325 MG PO TABS: 1.0000 | ORAL_TABLET | ORAL | 0 refills | Status: DC | PRN

## 2016-05-29 MED ORDER — FENTANYL CITRATE (PF) 100 MCG/2ML IJ SOLN
INTRAMUSCULAR | Status: AC
  Filled 2016-05-29: qty 2

## 2016-05-29 MED ORDER — OXYCODONE HCL 5 MG/5ML PO SOLN: 5.0000 mg | Freq: Once | ORAL | Status: AC | PRN

## 2016-05-29 MED ORDER — OXYCODONE HCL 5 MG PO TABS
5.0000 mg | ORAL_TABLET | Freq: Once | ORAL | Status: AC | PRN
  Administered 2016-05-29: 5 mg via ORAL

## 2016-05-29 MED ORDER — LIDOCAINE HCL (CARDIAC) 20 MG/ML IV SOLN
INTRAVENOUS | Status: DC | PRN
  Administered 2016-05-29: 100 mg via INTRAVENOUS

## 2016-05-29 MED ORDER — ACETAMINOPHEN 325 MG PO TABS: 650.0000 mg | ORAL_TABLET | ORAL | Status: DC | PRN

## 2016-05-29 MED ORDER — OXYCODONE HCL 5 MG PO TABS
ORAL_TABLET | ORAL | Status: AC
  Filled 2016-05-29: qty 1

## 2016-05-29 MED ORDER — LACTATED RINGERS IV SOLN
INTRAVENOUS | Status: DC
  Administered 2016-05-29 (×2): via INTRAVENOUS

## 2016-05-29 MED ORDER — MORPHINE SULFATE (PF) 2 MG/ML IV SOLN: 2.0000 mg | INTRAVENOUS | Status: DC | PRN

## 2016-05-29 MED ORDER — FENTANYL CITRATE (PF) 100 MCG/2ML IJ SOLN
INTRAMUSCULAR | Status: AC
  Filled 2016-05-29: qty 4

## 2016-05-29 MED ORDER — ROCURONIUM BROMIDE 100 MG/10ML IV SOLN
INTRAVENOUS | Status: DC | PRN
  Administered 2016-05-29: 50 mg via INTRAVENOUS

## 2016-05-29 MED ORDER — FENTANYL CITRATE (PF) 100 MCG/2ML IJ SOLN
25.0000 ug | INTRAMUSCULAR | Status: DC | PRN
  Administered 2016-05-29 (×4): 25 ug via INTRAVENOUS

## 2016-05-29 MED ORDER — ACETAMINOPHEN 650 MG RE SUPP: 650.0000 mg | RECTAL | Status: DC | PRN

## 2016-05-29 SURGICAL SUPPLY — 54 items
APL SKNCLS STERI-STRIP NONHPOA (GAUZE/BANDAGES/DRESSINGS) ×1
APPLIER CLIP LOGIC TI 5 (MISCELLANEOUS) IMPLANT
APPLIER CLIP ROT 10 11.4 M/L (STAPLE)
APR CLP MED LRG 11.4X10 (STAPLE)
APR CLP MED LRG 33X5 (MISCELLANEOUS)
BENZOIN TINCTURE PRP APPL 2/3 (GAUZE/BANDAGES/DRESSINGS) ×3 IMPLANT
BLADE SURG ROTATE 9660 (MISCELLANEOUS) IMPLANT
CANISTER SUCTION 2500CC (MISCELLANEOUS) IMPLANT
CHLORAPREP W/TINT 26ML (MISCELLANEOUS) ×3 IMPLANT
CLIP APPLIE ROT 10 11.4 M/L (STAPLE) IMPLANT
CLOSURE WOUND 1/2 X4 (GAUZE/BANDAGES/DRESSINGS) ×1
COVER SURGICAL LIGHT HANDLE (MISCELLANEOUS) ×3 IMPLANT
DEVICE SECURE STRAP 25 ABSORB (INSTRUMENTS) ×3 IMPLANT
DEVICE TROCAR PUNCTURE CLOSURE (ENDOMECHANICALS) ×3 IMPLANT
ELECT REM PT RETURN 9FT ADLT (ELECTROSURGICAL) ×3
ELECTRODE REM PT RTRN 9FT ADLT (ELECTROSURGICAL) ×1 IMPLANT
GAUZE SPONGE 2X2 8PLY STRL LF (GAUZE/BANDAGES/DRESSINGS) ×1 IMPLANT
GLOVE BIO SURGEON STRL SZ7 (GLOVE) ×2 IMPLANT
GLOVE BIO SURGEON STRL SZ7.5 (GLOVE) ×3 IMPLANT
GLOVE BIO SURGEON STRL SZ8 (GLOVE) ×2 IMPLANT
GLOVE BIOGEL PI IND STRL 7.0 (GLOVE) IMPLANT
GLOVE BIOGEL PI IND STRL 8.5 (GLOVE) IMPLANT
GLOVE BIOGEL PI INDICATOR 7.0 (GLOVE) ×2
GLOVE BIOGEL PI INDICATOR 8.5 (GLOVE) ×2
GOWN STRL REUS W/ TWL LRG LVL3 (GOWN DISPOSABLE) ×2 IMPLANT
GOWN STRL REUS W/ TWL XL LVL3 (GOWN DISPOSABLE) ×1 IMPLANT
GOWN STRL REUS W/TWL LRG LVL3 (GOWN DISPOSABLE) ×3
GOWN STRL REUS W/TWL XL LVL3 (GOWN DISPOSABLE) ×6
KIT BASIN OR (CUSTOM PROCEDURE TRAY) ×3 IMPLANT
KIT ROOM TURNOVER OR (KITS) ×3 IMPLANT
MARKER SKIN DUAL TIP RULER LAB (MISCELLANEOUS) ×3 IMPLANT
MESH VENTRALIGHT ST 4.5IN (Mesh General) ×2 IMPLANT
NDL INSUFFLATION 14GA 120MM (NEEDLE) ×1 IMPLANT
NDL SPNL 22GX3.5 QUINCKE BK (NEEDLE) IMPLANT
NEEDLE INSUFFLATION 14GA 120MM (NEEDLE) ×3 IMPLANT
NEEDLE SPNL 22GX3.5 QUINCKE BK (NEEDLE) IMPLANT
NS IRRIG 1000ML POUR BTL (IV SOLUTION) ×3 IMPLANT
PAD ARMBOARD 7.5X6 YLW CONV (MISCELLANEOUS) ×6 IMPLANT
SCISSORS LAP 5X35 DISP (ENDOMECHANICALS) ×3 IMPLANT
SET IRRIG TUBING LAPAROSCOPIC (IRRIGATION / IRRIGATOR) IMPLANT
SLEEVE ENDOPATH XCEL 5M (ENDOMECHANICALS) ×3 IMPLANT
SPONGE GAUZE 2X2 STER 10/PKG (GAUZE/BANDAGES/DRESSINGS) ×2
STRIP CLOSURE SKIN 1/2X4 (GAUZE/BANDAGES/DRESSINGS) ×2 IMPLANT
SUT CHROMIC 2 0 SH (SUTURE) ×3 IMPLANT
SUT MNCRL AB 4-0 PS2 18 (SUTURE) ×3 IMPLANT
SUT NOVA 1 T20/GS 25DT (SUTURE) IMPLANT
SUT PROLENE 2 0 KS (SUTURE) ×4 IMPLANT
TOWEL OR 17X24 6PK STRL BLUE (TOWEL DISPOSABLE) ×3 IMPLANT
TOWEL OR 17X26 10 PK STRL BLUE (TOWEL DISPOSABLE) ×3 IMPLANT
TRAY LAPAROSCOPIC MC (CUSTOM PROCEDURE TRAY) ×3 IMPLANT
TROCAR XCEL BLUNT TIP 100MML (ENDOMECHANICALS) IMPLANT
TROCAR XCEL NON-BLD 11X100MML (ENDOMECHANICALS) IMPLANT
TROCAR XCEL NON-BLD 5MMX100MML (ENDOMECHANICALS) ×3 IMPLANT
TUBING INSUFFLATION (TUBING) ×3 IMPLANT

## 2016-05-29 NOTE — H&P (Signed)
History of Present Illness The patient is a 71 year old male who presents with an umbilical hernia. Patient is a 71 year old male who is referred by Dr. Woody Seller she is for evaluation of an umbilical hernia. Patient states that he has had this for several years. He states that he's had no pain at this time. He states that recently over the last several weeks he did have a episode of incarceration which ultimately self reduced. He states that he has pain after eating a large amount of food.  Patient does state that he had a CABG ?2002.   Other Problems  Arthritis  Back Pain  Diabetes Mellitus  Gastroesophageal Reflux Disease  Hemorrhoids  Hypercholesterolemia   Past Surgical History Anal Fissure Repair  Colon Polyp Removal - Colonoscopy  Colon Polyp Removal - Open  Coronary Artery Bypass Graft  Gallbladder Surgery - Open  Shoulder Surgery  Left. Tonsillectomy   Diagnostic Studies History  Colonoscopy  within last year  Allergies  No Known Drug Allergies 01/07/2016  Medication History  Atenolol (50MG  Tablet, Oral) Active. Famotidine (10MG  Tablet, Oral) Active. Uroxatral (10MG  Tablet ER, Oral) Active. Nabumetone (750MG  Tablet, Oral) Active. MetFORMIN HCl (500MG  Tablet, Oral) Active. Gemfibrozil (600MG  Tablet, Oral) Active. Aspirin (81MG  Tablet Chewable, Oral) Active. Medications Reconciled  Social History  Caffeine use  Carbonated beverages, Coffee. No alcohol use  No drug use  Tobacco use  Never smoker.  Family History  Colon Cancer  Father. Colon Polyps  Brother. Heart Disease  Brother, Father, Sister. Heart disease in male family member before age 32  Hypertension  Father. Melanoma  Brother, Father. Prostate Cancer  Father.    Review of Systems  General Present- Fatigue. Not Present- Appetite Loss, Chills, Fever, Night Sweats, Weight Gain and Weight Loss. HEENT Present- Hearing Loss, Ringing in the Ears, Seasonal Allergies  and Wears glasses/contact lenses. Not Present- Earache, Hoarseness, Nose Bleed, Oral Ulcers, Sinus Pain, Sore Throat, Visual Disturbances and Yellow Eyes. Respiratory Present- Snoring. Not Present- Bloody sputum, Chronic Cough, Difficulty Breathing and Wheezing. Breast Not Present- Breast Mass, Breast Pain, Nipple Discharge and Skin Changes. Cardiovascular Present- Rapid Heart Rate and Shortness of Breath. Not Present- Chest Pain, Difficulty Breathing Lying Down, Leg Cramps, Palpitations and Swelling of Extremities. Gastrointestinal Present- Bloating and Hemorrhoids. Not Present- Abdominal Pain, Bloody Stool, Change in Bowel Habits, Chronic diarrhea, Constipation, Difficulty Swallowing, Excessive gas, Gets full quickly at meals, Indigestion, Nausea, Rectal Pain and Vomiting. Musculoskeletal Present- Back Pain, Joint Pain, Joint Stiffness and Muscle Pain. Not Present- Muscle Weakness and Swelling of Extremities. Neurological Present- Headaches. Not Present- Decreased Memory, Fainting, Numbness, Seizures, Tingling, Tremor, Trouble walking and Weakness. Psychiatric Not Present- Anxiety, Bipolar, Change in Sleep Pattern, Depression, Fearful and Frequent crying. Endocrine Present- Cold Intolerance, Hot flashes and New Diabetes. Not Present- Excessive Hunger, Hair Changes and Heat Intolerance. Hematology Present- Blood Thinners and Easy Bruising. Not Present- Excessive bleeding, Gland problems, HIV and Persistent Infections.  BP 138/65   Pulse 63   Temp 97.5 F (36.4 C) (Oral)   Resp 20   SpO2 97%     Physical Exam  General Mental Status-Alert. General Appearance-Consistent with stated age. Hydration-Well hydrated. Voice-Normal.  Head and Neck Head-normocephalic, atraumatic with no lesions or palpable masses. Trachea-midline. Thyroid Gland Characteristics - normal size and consistency.  Chest and Lung Exam Chest and lung exam reveals -quiet, even and easy respiratory effort  with no use of accessory muscles and on auscultation, normal breath sounds, no adventitious sounds and normal vocal resonance.  Inspection Chest Wall - Normal. Back - normal.  Cardiovascular Cardiovascular examination reveals -normal heart sounds, regular rate and rhythm with no murmurs and normal pedal pulses bilaterally.  Abdomen Inspection Skin - Scar - no surgical scars. Hernias - Umbilical hernia - Incarcerated(1- 2 cm.). Palpation/Percussion Normal exam - Soft, Non Tender, No Rebound tenderness, No Rigidity (guarding) and No hepatosplenomegaly. Auscultation Normal exam - Bowel sounds normal.    Assessment & Plan  UMBILICAL HERNIA WITHOUT OBSTRUCTION AND WITHOUT GANGRENE (K42.9) Impression: 71 year old male with an umbilical hernia, incarcerated. Patient had an episode of what appears to be significant incarceration or strangulation.  1. 1. The patient will like to proceed to the operating room for laparoscopic umbilical inguinal hernia repair with mesh.  2. I discussed with the patient the signs and symptoms of incarceration and strangulation and the need to proceed to the ER should they occur.  3. I discussed with the patient the risks and benefits of the procedure to include but not limited to: Infection, bleeding, damage to surrounding structures, possible need for further surgery, possible nerve pain, and possible recurrence. The patient was understanding and wishes to proceed.

## 2016-05-29 NOTE — Transfer of Care (Signed)
Immediate Anesthesia Transfer of Care Note  Patient: Joseph Burton  Procedure(s) Performed: Procedure(s): LAPAROSCOPIC UMBILICAL HERNIA (N/A) INSERTION OF MESH (N/A)  Patient Location: PACU  Anesthesia Type:General  Level of Consciousness: awake, alert  and oriented  Airway & Oxygen Therapy: Patient Spontanous Breathing  Post-op Assessment: Report given to RN and Post -op Vital signs reviewed and stable  Post vital signs: Reviewed and stable  Last Vitals:  Vitals:   05/29/16 1135 05/29/16 1145  BP: 127/82   Pulse: 65 71  Resp: 10 13  Temp:      Last Pain:  Vitals:   05/29/16 1145  TempSrc:   PainSc: Asleep      Patients Stated Pain Goal: 4 ([id number])  Complications: No apparent anesthesia complications

## 2016-05-29 NOTE — Op Note (Signed)
05/29/2016  11:09 AM  PATIENT:  Joseph Burton  71 y.o. male  PRE-OPERATIVE DIAGNOSIS:  UMBILICAL HERNIA REPAIR  POST-OPERATIVE DIAGNOSIS:  INCARCERATED UMBILICAL HERNIA REPAIR  PROCEDURE:  Procedure(s): LAPAROSCOPIC UMBILICAL HERNIA (N/A) INSERTION OF MESH (N/A)  SURGEON:  Surgeon(s) and Role:    * Ralene Ok, MD - Primary  ANESTHESIA:   local and general  EBL:  <5CC  BLOOD ADMINISTERED:none  DRAINS: none   LOCAL MEDICATIONS USED:  BUPIVICAINE   SPECIMEN:  No Specimen  DISPOSITION OF SPECIMEN:  N/A  COUNTS:  YES  TOURNIQUET:  * No tourniquets in log *  DICTATION: .Dragon Dictation   Details of the procedure:   After the patient was consented patient was taken back to the operating room patient was then placed in supine position bilateral SCDs in place.  The patient was prepped and draped in the usual sterile fashion. After antibiotics were confirmed a timeout was called and all facts were verified. The Veress needle technique was used to insuflate the abdomen at Palmer's point. The abdomen was insufflated to 14 mm mercury. Subsequently a 5 mm trocar was placed a camera inserted there was no injury to any intra-abdominal organs.    There was seen to be an incarcerated  umbilical hernia.  A second camera port was in placed into the left lower quadrant.   At this the Falicform ligament was taken down with Bovie cautery maintaining hemostasis.  I proceeded to reduce the hernia contents.  Once the hernia was cleared away, a Bard Ventralight 11.5cm  mesh was inserted into the abdomen.  The mesh was secured circumferentially with am Securestrap tacker in a double crown fashion.  2-0 prolenes were used at 3:00, 6:00, 9:00, and 12:00 as transfascial sutures using a endoclose decive.    The omentum was brought over the area of the mesh. The pneumoperitoneum was evacuated  & all trocars  were removed. The skin was reapproximated with 4-0  Monocryl sutures in a  subcuticular fashion. The skin was dressed with Steri-Strips tape and gauze.  The patient was taken to the recovery room in stable condition.   PLAN OF CARE: Discharge to home after PACU  PATIENT DISPOSITION:  PACU - hemodynamically stable.   Delay start of Pharmacological VTE agent (>24hrs) due to surgical blood loss or risk of bleeding: not applicable

## 2016-05-29 NOTE — Anesthesia Preprocedure Evaluation (Signed)
Anesthesia Evaluation  Patient identified by MRN, date of birth, ID band Patient awake    Reviewed: Allergy & Precautions, H&P , NPO status , Patient's Chart, lab work & pertinent test results, reviewed documented beta blocker date and time   Airway Mallampati: II  TM Distance: >3 FB Neck ROM: full    Dental no notable dental hx.    Pulmonary    Pulmonary exam normal breath sounds clear to auscultation       Cardiovascular Exercise Tolerance: Good hypertension, Pt. on home beta blockers Normal cardiovascular exam     Neuro/Psych negative neurological ROS  negative psych ROS   GI/Hepatic Neg liver ROS,   Endo/Other  negative endocrine ROSdiabetes, Type 2, Oral Hypoglycemic Agents  Renal/GU negative Renal ROS  negative genitourinary   Musculoskeletal   Abdominal Normal abdominal exam  (+)   Peds  Hematology negative hematology ROS (+)   Anesthesia Other Findings   Reproductive/Obstetrics negative OB ROS                             Anesthesia Physical Anesthesia Plan  ASA: II  Anesthesia Plan: General   Post-op Pain Management:    Induction: Intravenous  Airway Management Planned: Oral ETT  Additional Equipment:   Intra-op Plan:   Post-operative Plan: Extubation in OR  Informed Consent: I have reviewed the patients History and Physical, chart, labs and discussed the procedure including the risks, benefits and alternatives for the proposed anesthesia with the patient or authorized representative who has indicated his/her understanding and acceptance.   Dental Advisory Given  Plan Discussed with: CRNA and Surgeon  Anesthesia Plan Comments:         Anesthesia Quick Evaluation

## 2016-05-29 NOTE — Discharge Instructions (Signed)
[facility] _______[facility] ° °UMBILICAL  HERNIA REPAIR: POST OP INSTRUCTIONS ° °Always review your discharge instruction sheet given to you by the facility where your surgery was performed. °IF YOU HAVE DISABILITY OR FAMILY LEAVE FORMS, YOU MUST BRING THEM TO THE OFFICE FOR PROCESSING.   °DO NOT GIVE THEM TO YOUR DOCTOR. ° °1. A  prescription for pain medication may be given to you upon discharge.  Take your pain medication as prescribed, if needed.  If narcotic pain medicine is not needed, then you may take acetaminophen (Tylenol) or ibuprofen (Advil) as needed. °2. Take your usually prescribed medications unless otherwise directed. °If you need a refill on your pain medication, please contact your pharmacy.  They will contact our office to request authorization. Prescriptions will not be filled after 5 pm or on week-ends. °3. You should follow a light diet the first 24 hours after arrival home, such as soup and crackers, etc.  Be sure to include lots of fluids daily.  Resume your normal diet the day after surgery. °4.Most patients will experience some swelling and bruising around the umbilicus or in the groin and scrotum.  Ice packs and reclining will help.  Swelling and bruising can take several days to resolve.  °6. It is common to experience some constipation if taking pain medication after surgery.  Increasing fluid intake and taking a stool softener (such as Colace) will usually help or prevent this problem from occurring.  A mild laxative (Milk of Magnesia or Miralax) should be taken according to package directions if there are no bowel movements after 48 hours. °7. Unless discharge instructions indicate otherwise, you may remove your bandages 24-48 hours after surgery, and you may shower at that time.  You may have steri-strips (small skin tapes) in place directly over the incision.  These strips should be left on the skin for 7-10 days.  If your surgeon used skin glue on the incision, you may  shower in 24 hours.  The glue will flake off over the next 2-3 weeks.  Any sutures or staples will be removed at the office during your follow-up visit. °8. ACTIVITIES:  You may resume regular (light) daily activities beginning the next day--such as daily self-care, walking, climbing stairs--gradually increasing activities as tolerated.  You may have sexual intercourse when it is comfortable.  Refrain from any heavy lifting or straining until approved by your doctor. ° °a.You may drive when you are no longer taking prescription pain medication, you can comfortably wear a seatbelt, and you can safely maneuver your car and apply brakes. °b.RETURN TO WORK:   °_____________________________________________ ° °9.You should see your doctor in the office for a follow-up appointment approximately 2-3 weeks after your surgery.  Make sure that you call for this appointment within a day or two after you arrive home to insure a convenient appointment time. °10.OTHER INSTRUCTIONS: _________________________ °   _____________________________________ ° °WHEN TO CALL YOUR DOCTOR: °1. Fever over 101.0 °2. Inability to urinate °3. Nausea and/or vomiting °4. Extreme swelling or bruising °5. Continued bleeding from incision. °6. Increased pain, redness, or drainage from the incision ° °The clinic staff is available to answer your questions during regular business hours.  Please don’t hesitate to call and ask to speak to one of the nurses for clinical concerns.  If you have a medical emergency, go to the nearest emergency room or call 911.  A surgeon from [facility] is always on call at the hospital ° ° °[address], [address], [place], [place]  [address] ? ° [address] [address], [place], [place]   [address] °[phone] ? [phone] ? FAX [phone] °Web site: [email or web address] ° °

## 2016-05-29 NOTE — Anesthesia Postprocedure Evaluation (Addendum)
Anesthesia Post Note  Patient: Joseph Burton  Procedure(s) Performed: Procedure(s) (LRB): LAPAROSCOPIC UMBILICAL HERNIA (N/A) INSERTION OF MESH (N/A)  Patient location during evaluation: PACU Anesthesia Type: General Level of consciousness: awake Pain management: pain level controlled Vital Signs Assessment: post-procedure vital signs reviewed and stable Respiratory status: spontaneous breathing Cardiovascular status: stable Postop Assessment: no signs of nausea or vomiting and adequate PO intake Anesthetic complications: no        Last Vitals:  Vitals:   05/29/16 1200 05/29/16 1203  BP:  137/77  Pulse: 66 66  Resp: 15 14  Temp:  36.4 C    Last Pain:  Vitals:   05/29/16 1145  TempSrc:   PainSc: Asleep   Pain Goal: Patients Stated Pain Goal: 4 (05/29/16 0910)               Pilot Prindle JR,JOHN Mateo Flow

## 2016-05-29 NOTE — Anesthesia Procedure Notes (Signed)
Procedure Name: Intubation Date/Time: 05/29/2016 10:39 AM Performed by: Manuela Schwartz B Pre-anesthesia Checklist: Patient identified, Emergency Drugs available, Suction available, Patient being monitored and Timeout performed Patient Re-evaluated:Patient Re-evaluated prior to inductionOxygen Delivery Method: Circle system utilized Preoxygenation: Pre-oxygenation with 100% oxygen Intubation Type: IV induction Ventilation: Mask ventilation without difficulty Laryngoscope Size: Mac and 3 Grade View: Grade I Tube type: Oral Tube size: 7.5 mm Number of attempts: 1 Airway Equipment and Method: Stylet Placement Confirmation: ETT inserted through vocal cords under direct vision,  positive ETCO2 and breath sounds checked- equal and bilateral Secured at: 22 cm Tube secured with: Tape Dental Injury: Teeth and Oropharynx as per pre-operative assessment

## 2016-06-01 ENCOUNTER — Encounter (HOSPITAL_COMMUNITY): Payer: Self-pay | Admitting: General Surgery

## 2016-06-10 DIAGNOSIS — E7800 Pure hypercholesterolemia, unspecified: Secondary | ICD-10-CM | POA: Diagnosis not present

## 2016-06-10 DIAGNOSIS — I10 Essential (primary) hypertension: Secondary | ICD-10-CM | POA: Diagnosis not present

## 2016-06-10 DIAGNOSIS — I2510 Atherosclerotic heart disease of native coronary artery without angina pectoris: Secondary | ICD-10-CM | POA: Diagnosis not present

## 2016-06-10 DIAGNOSIS — E119 Type 2 diabetes mellitus without complications: Secondary | ICD-10-CM | POA: Diagnosis not present

## 2016-06-26 DIAGNOSIS — H2513 Age-related nuclear cataract, bilateral: Secondary | ICD-10-CM | POA: Diagnosis not present

## 2016-06-26 DIAGNOSIS — H02834 Dermatochalasis of left upper eyelid: Secondary | ICD-10-CM | POA: Diagnosis not present

## 2016-06-26 DIAGNOSIS — H04123 Dry eye syndrome of bilateral lacrimal glands: Secondary | ICD-10-CM | POA: Diagnosis not present

## 2016-06-26 DIAGNOSIS — H02831 Dermatochalasis of right upper eyelid: Secondary | ICD-10-CM | POA: Diagnosis not present

## 2016-07-16 DIAGNOSIS — R1031 Right lower quadrant pain: Secondary | ICD-10-CM | POA: Diagnosis not present

## 2016-07-16 DIAGNOSIS — Z6830 Body mass index (BMI) 30.0-30.9, adult: Secondary | ICD-10-CM | POA: Diagnosis not present

## 2016-07-16 DIAGNOSIS — E1165 Type 2 diabetes mellitus with hyperglycemia: Secondary | ICD-10-CM | POA: Diagnosis not present

## 2016-07-16 DIAGNOSIS — Z713 Dietary counseling and surveillance: Secondary | ICD-10-CM | POA: Diagnosis not present

## 2016-07-16 DIAGNOSIS — Z789 Other specified health status: Secondary | ICD-10-CM | POA: Diagnosis not present

## 2016-07-16 DIAGNOSIS — Z299 Encounter for prophylactic measures, unspecified: Secondary | ICD-10-CM | POA: Diagnosis not present

## 2016-07-16 DIAGNOSIS — I2510 Atherosclerotic heart disease of native coronary artery without angina pectoris: Secondary | ICD-10-CM | POA: Diagnosis not present

## 2016-07-27 DIAGNOSIS — R1031 Right lower quadrant pain: Secondary | ICD-10-CM | POA: Diagnosis not present

## 2016-07-31 DIAGNOSIS — Z299 Encounter for prophylactic measures, unspecified: Secondary | ICD-10-CM | POA: Diagnosis not present

## 2016-07-31 DIAGNOSIS — Z713 Dietary counseling and surveillance: Secondary | ICD-10-CM | POA: Diagnosis not present

## 2016-07-31 DIAGNOSIS — E1165 Type 2 diabetes mellitus with hyperglycemia: Secondary | ICD-10-CM | POA: Diagnosis not present

## 2016-07-31 DIAGNOSIS — K219 Gastro-esophageal reflux disease without esophagitis: Secondary | ICD-10-CM | POA: Diagnosis not present

## 2016-07-31 DIAGNOSIS — E7800 Pure hypercholesterolemia, unspecified: Secondary | ICD-10-CM | POA: Diagnosis not present

## 2016-07-31 DIAGNOSIS — I2510 Atherosclerotic heart disease of native coronary artery without angina pectoris: Secondary | ICD-10-CM | POA: Diagnosis not present

## 2016-07-31 DIAGNOSIS — N400 Enlarged prostate without lower urinary tract symptoms: Secondary | ICD-10-CM | POA: Diagnosis not present

## 2016-07-31 DIAGNOSIS — I10 Essential (primary) hypertension: Secondary | ICD-10-CM | POA: Diagnosis not present

## 2016-09-03 DIAGNOSIS — E7800 Pure hypercholesterolemia, unspecified: Secondary | ICD-10-CM | POA: Diagnosis not present

## 2016-09-03 DIAGNOSIS — I2510 Atherosclerotic heart disease of native coronary artery without angina pectoris: Secondary | ICD-10-CM | POA: Diagnosis not present

## 2016-09-03 DIAGNOSIS — E119 Type 2 diabetes mellitus without complications: Secondary | ICD-10-CM | POA: Diagnosis not present

## 2016-09-03 DIAGNOSIS — I10 Essential (primary) hypertension: Secondary | ICD-10-CM | POA: Diagnosis not present

## 2016-10-05 DIAGNOSIS — E119 Type 2 diabetes mellitus without complications: Secondary | ICD-10-CM | POA: Diagnosis not present

## 2016-10-05 DIAGNOSIS — E7800 Pure hypercholesterolemia, unspecified: Secondary | ICD-10-CM | POA: Diagnosis not present

## 2016-10-05 DIAGNOSIS — I10 Essential (primary) hypertension: Secondary | ICD-10-CM | POA: Diagnosis not present

## 2016-10-05 DIAGNOSIS — I2510 Atherosclerotic heart disease of native coronary artery without angina pectoris: Secondary | ICD-10-CM | POA: Diagnosis not present

## 2016-10-07 NOTE — Progress Notes (Signed)
Cardiology Office Note  Date: 10/08/2016   ID: Joseph Burton, DOB Oct 15, 1945, MRN [id number]  PCP: Joseph Chroman, MD  Primary Cardiologist: Joseph Lesches, MD   Chief Complaint  Patient presents with  . Coronary Artery Disease    History of Present Illness: Joseph Burton is a 71 y.o. male seen in consultation back in November 2017. He presents for a routine follow-up visit. Reports no angina symptoms on medical therapy. He is retired, enjoys doing chores around the house. Does not report any major functional change.  Patient underwent umbilical hernia repair in January of this year with Joseph Burton. No obvious perioperative cardiac events.  Cardiac structural and ischemic testing from November 2017 is outlined below. I went over the results. Plan will be to continue with medical therapy and observation at this point.  Past Medical History:  Diagnosis Date  . Arthritis   . Coronary artery disease    Multivessel status post CABG 2002  . Early cataracts, bilateral   . Environmental and seasonal allergies   . Fatty liver   . GERD (gastroesophageal reflux disease)   . Hearing loss   . Hypertension   . Sleep apnea    was tested at [facility] about 15 yrs.  Doesn't wear mask or use machine  . Type 2 diabetes mellitus ([place])    dx back in 2015  . Umbilical hernia     Past Surgical History:  Procedure Laterality Date  . CARDIAC CATHETERIZATION     2002  . CHOLECYSTECTOMY     3 years ago  . COLONOSCOPY N/A 10/02/2015   Procedure: COLONOSCOPY;  Surgeon: Joseph Houston, MD;  [facility];  Service: Endoscopy;  Laterality: N/A;  930  . CORONARY ARTERY BYPASS GRAFT  2002   2002  . INSERTION OF MESH N/A 05/29/2016   Procedure: INSERTION OF MESH;  Surgeon: Joseph Ok, MD;  Location: [facility];  Service: General;  Laterality: N/A;  . JOINT REPLACEMENT     Left shoulder  . SHOULDER ARTHROSCOPY W/ ROTATOR CUFF REPAIR Left 07/2010  . TONSILLECTOMY   1969  . TREATMENT FISTULA ANAL    . UMBILICAL HERNIA REPAIR N/A 05/29/2016   Procedure: LAPAROSCOPIC UMBILICAL HERNIA;  Surgeon: Joseph Ok, MD;  Location: [facility];  Service: General;  Laterality: N/A;    Current Outpatient Prescriptions  Medication Sig Dispense Refill  . alfuzosin (UROXATRAL) 10 MG 24 hr tablet Take 10 mg by mouth at bedtime.     Marland Kitchen aspirin EC 81 MG tablet Take 81 mg by mouth daily.    . Aspirin-Acetaminophen-Caffeine (GOODYS EXTRA STRENGTH) 520-260-32.5 MG PACK Take 1 packet by mouth every 8 (eight) hours as needed (for pain.).    Marland Kitchen atenolol (TENORMIN) 50 MG tablet Take 25 mg by mouth at bedtime.   9  . azelastine (ASTELIN) 0.1 % nasal spray Place 1 spray into both nostrils 2 (two) times daily as needed for rhinitis. Use in each nostril as directed    . famotidine (PEPCID) 40 MG tablet Take 40 mg by mouth daily.     . fexofenadine (ALLERGY RELIEF) 180 MG tablet Take 180 mg by mouth daily as needed for allergies or rhinitis.    . fluticasone (FLONASE) 50 MCG/ACT nasal spray Place 2 sprays into both nostrils daily as needed for allergies or rhinitis.    Marland Kitchen gemfibrozil (LOPID) 600 MG tablet Take 600 mg by mouth at bedtime.     Marland Kitchen ipratropium (ATROVENT) 0.06 %  nasal spray Place 2 sprays into both nostrils 3 (three) times daily as needed for rhinitis.    . metFORMIN (GLUCOPHAGE-XR) 500 MG 24 hr tablet Take 500 mg by mouth daily with breakfast.     . nabumetone (RELAFEN) 750 MG tablet Take 750 mg by mouth 2 (two) times daily.      No current facility-administered medications for this visit.    Allergies:  No known allergies   Social History: The patient  reports that he has never smoked. He has never used smokeless tobacco. He reports that he does not drink alcohol or use drugs.   ROS:  Please see the history of present illness. Otherwise, complete review of systems is positive for none.  All other systems are reviewed and negative.   Physical Exam: VS:  BP 122/70   Pulse  77   Ht 5\' 9"  (1.753 m)   Wt 201 lb (91.2 kg)   SpO2 98%   BMI 29.68 kg/m , BMI Body mass index is 29.68 kg/m.  Wt Readings from Last 3 Encounters:  10/08/16 201 lb (91.2 kg)  05/25/16 204 lb 14.4 oz (92.9 kg)  03/25/16 190 lb (86.2 kg)    General: Patient appears comfortable at rest. HEENT: Conjunctiva and lids normal, oropharynx clear. Neck: Supple, no elevated JVP or carotid bruits, no thyromegaly. Lungs: Clear to auscultation, nonlabored breathing at rest. Cardiac: Regular rate and rhythm, no S3, soft systolic murmur, no pericardial rub. Abdomen: Soft, nontender, bowel sounds present, no guarding or rebound. Extremities: No pitting edema, distal pulses 2+. Skin: Warm and dry. Musculoskeletal: No kyphosis. Neuropsychiatric: Alert and oriented x3, affect grossly appropriate.  ECG: I personally reviewed the tracing from 03/25/2016 which showed sinus rhythm with diffuse nonspecific ST-T wave abnormalities.  Recent Labwork: 05/25/2016: ALT 25; AST 22; BUN 14; Creatinine, Ser 0.83; Hemoglobin 14.4; Platelets 167; Potassium 4.5; Sodium 141   Other Studies Reviewed Today:  Echocardiogram 04/02/2016: Study Conclusions  - Left ventricle: The cavity size was normal. Wall thickness was   increased in a pattern of mild LVH. Systolic function was   vigorous. The estimated ejection fraction was in the range of 65%   to 70%. Wall motion was normal; there were no regional wall   motion abnormalities. Features are consistent with a pseudonormal   left ventricular filling pattern, with concomitant abnormal   relaxation and increased filling pressure (grade 2 diastolic   dysfunction). - Mitral valve: There was trivial regurgitation. - Right atrium: Central venous pressure (est): 3 mm Hg. - Tricuspid valve: There was mild regurgitation. - Pulmonary arteries: PA peak pressure: 33 mm Hg (S). - Pericardium, extracardiac: There was no pericardial effusion.  Impressions:  - Mild LVH with  LVEF 65-70%. Grade 2 diastolic dysfunction. Trivial   mitral regurgitation. Mild tricuspid regurgitation with PASP 33   mmHg.  Lexiscan Myoview 04/02/2016:  No diagnostic ST segment changes to indicate ischemia.  Small, mild intensity, apical anterior defect that is most consistent with attenuation artifact. No large, reversible perfusion defects to indicate ischemic territories.  This is a low risk study.  Nuclear stress EF: 70%.  Assessment and Plan:  1. CAD status post CABG in 2002. Recent follow-up cardiac testing showed normal LVEF and no large ischemic territories. He remains clinically stable without angina on medical therapy and we will continue with observation.  2. Obstructive sleep apnea on CPAP.  3. Type 2 diabetes mellitus, on Glucophage. He follows with Dr. Woody Seller.  Current medicines were reviewed with the patient  today.  Disposition: Follow-up in one year, sooner if needed.  Signed, Satira Sark, MD, [facility] 10/08/2016 11:41 AM    [facility] [place], [place], [place] [address] Phone: [phone]; Fax: [phone]

## 2016-10-08 ENCOUNTER — Ambulatory Visit (INDEPENDENT_AMBULATORY_CARE_PROVIDER_SITE_OTHER): Payer: Medicare Other | Admitting: Cardiology

## 2016-10-08 ENCOUNTER — Encounter: Payer: Self-pay | Admitting: Cardiology

## 2016-10-08 VITALS — BP 122/70 | HR 77 | Ht 69.0 in | Wt 201.0 lb

## 2016-10-08 DIAGNOSIS — I25119 Atherosclerotic heart disease of native coronary artery with unspecified angina pectoris: Secondary | ICD-10-CM

## 2016-10-08 DIAGNOSIS — E1159 Type 2 diabetes mellitus with other circulatory complications: Secondary | ICD-10-CM | POA: Diagnosis not present

## 2016-10-08 DIAGNOSIS — Z9989 Dependence on other enabling machines and devices: Secondary | ICD-10-CM | POA: Diagnosis not present

## 2016-10-08 DIAGNOSIS — I209 Angina pectoris, unspecified: Secondary | ICD-10-CM | POA: Diagnosis not present

## 2016-10-08 DIAGNOSIS — G4733 Obstructive sleep apnea (adult) (pediatric): Secondary | ICD-10-CM

## 2016-10-08 NOTE — Patient Instructions (Signed)

## 2016-10-23 NOTE — Addendum Note (Signed)
Addendum  created 10/23/16 0917 by Prentiss Polio, MD   Sign clinical note    

## 2016-11-06 DIAGNOSIS — Z299 Encounter for prophylactic measures, unspecified: Secondary | ICD-10-CM | POA: Diagnosis not present

## 2016-11-06 DIAGNOSIS — N400 Enlarged prostate without lower urinary tract symptoms: Secondary | ICD-10-CM | POA: Diagnosis not present

## 2016-11-06 DIAGNOSIS — Z713 Dietary counseling and surveillance: Secondary | ICD-10-CM | POA: Diagnosis not present

## 2016-11-06 DIAGNOSIS — I2510 Atherosclerotic heart disease of native coronary artery without angina pectoris: Secondary | ICD-10-CM | POA: Diagnosis not present

## 2016-11-06 DIAGNOSIS — Z6831 Body mass index (BMI) 31.0-31.9, adult: Secondary | ICD-10-CM | POA: Diagnosis not present

## 2016-11-06 DIAGNOSIS — K5730 Diverticulosis of large intestine without perforation or abscess without bleeding: Secondary | ICD-10-CM | POA: Diagnosis not present

## 2016-11-06 DIAGNOSIS — I10 Essential (primary) hypertension: Secondary | ICD-10-CM | POA: Diagnosis not present

## 2016-11-06 DIAGNOSIS — E7800 Pure hypercholesterolemia, unspecified: Secondary | ICD-10-CM | POA: Diagnosis not present

## 2016-11-06 DIAGNOSIS — E119 Type 2 diabetes mellitus without complications: Secondary | ICD-10-CM | POA: Diagnosis not present

## 2016-11-20 IMAGING — NM NM MYOCAR MULTI W/SPECT W/WALL MOTION & EF
2 series · 12 of 12 positions shown · non-contrast
Comparison: none

[Series 1: rest · 8.28mm/px · 6 of 64 frames shown]
[frame 6/64]
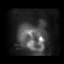
[frame 16/64]
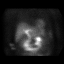
[frame 27/64]
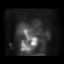
[frame 38/64]
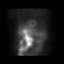
[frame 48/64]
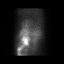
[frame 59/64]
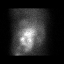

[Series 2: stress gated · 8.28mm/px · 6 of 64 frames shown]
[frame 6/64]
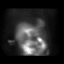
[frame 16/64]
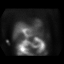
[frame 27/64]
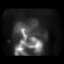
[frame 38/64]
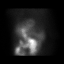
[frame 48/64]
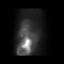
[frame 59/64]
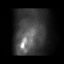

[12 of 12 positions shown; findings below may reference images not displayed]

Canned report from images found in remote index.

Refer to host system for actual result text.

## 2016-12-18 DIAGNOSIS — I2510 Atherosclerotic heart disease of native coronary artery without angina pectoris: Secondary | ICD-10-CM | POA: Diagnosis not present

## 2016-12-18 DIAGNOSIS — E119 Type 2 diabetes mellitus without complications: Secondary | ICD-10-CM | POA: Diagnosis not present

## 2016-12-18 DIAGNOSIS — I10 Essential (primary) hypertension: Secondary | ICD-10-CM | POA: Diagnosis not present

## 2016-12-18 DIAGNOSIS — Z6831 Body mass index (BMI) 31.0-31.9, adult: Secondary | ICD-10-CM | POA: Diagnosis not present

## 2016-12-18 DIAGNOSIS — E7800 Pure hypercholesterolemia, unspecified: Secondary | ICD-10-CM | POA: Diagnosis not present

## 2016-12-18 DIAGNOSIS — H6980 Other specified disorders of Eustachian tube, unspecified ear: Secondary | ICD-10-CM | POA: Diagnosis not present

## 2016-12-18 DIAGNOSIS — N400 Enlarged prostate without lower urinary tract symptoms: Secondary | ICD-10-CM | POA: Diagnosis not present

## 2016-12-18 DIAGNOSIS — K5730 Diverticulosis of large intestine without perforation or abscess without bleeding: Secondary | ICD-10-CM | POA: Diagnosis not present

## 2016-12-18 DIAGNOSIS — Z299 Encounter for prophylactic measures, unspecified: Secondary | ICD-10-CM | POA: Diagnosis not present

## 2017-02-11 DIAGNOSIS — E7800 Pure hypercholesterolemia, unspecified: Secondary | ICD-10-CM | POA: Diagnosis not present

## 2017-02-11 DIAGNOSIS — I10 Essential (primary) hypertension: Secondary | ICD-10-CM | POA: Diagnosis not present

## 2017-02-11 DIAGNOSIS — E1165 Type 2 diabetes mellitus with hyperglycemia: Secondary | ICD-10-CM | POA: Diagnosis not present

## 2017-02-11 DIAGNOSIS — N400 Enlarged prostate without lower urinary tract symptoms: Secondary | ICD-10-CM | POA: Diagnosis not present

## 2017-02-11 DIAGNOSIS — Z299 Encounter for prophylactic measures, unspecified: Secondary | ICD-10-CM | POA: Diagnosis not present

## 2017-02-11 DIAGNOSIS — Z6829 Body mass index (BMI) 29.0-29.9, adult: Secondary | ICD-10-CM | POA: Diagnosis not present

## 2017-02-11 DIAGNOSIS — I2510 Atherosclerotic heart disease of native coronary artery without angina pectoris: Secondary | ICD-10-CM | POA: Diagnosis not present

## 2017-02-15 DIAGNOSIS — I10 Essential (primary) hypertension: Secondary | ICD-10-CM | POA: Diagnosis not present

## 2017-02-15 DIAGNOSIS — E119 Type 2 diabetes mellitus without complications: Secondary | ICD-10-CM | POA: Diagnosis not present

## 2017-02-15 DIAGNOSIS — E7800 Pure hypercholesterolemia, unspecified: Secondary | ICD-10-CM | POA: Diagnosis not present

## 2017-02-15 DIAGNOSIS — I2510 Atherosclerotic heart disease of native coronary artery without angina pectoris: Secondary | ICD-10-CM | POA: Diagnosis not present

## 2017-03-30 DIAGNOSIS — I10 Essential (primary) hypertension: Secondary | ICD-10-CM | POA: Diagnosis not present

## 2017-03-30 DIAGNOSIS — J069 Acute upper respiratory infection, unspecified: Secondary | ICD-10-CM | POA: Diagnosis not present

## 2017-03-30 DIAGNOSIS — Z6830 Body mass index (BMI) 30.0-30.9, adult: Secondary | ICD-10-CM | POA: Diagnosis not present

## 2017-03-30 DIAGNOSIS — Z299 Encounter for prophylactic measures, unspecified: Secondary | ICD-10-CM | POA: Diagnosis not present

## 2017-03-30 DIAGNOSIS — Z713 Dietary counseling and surveillance: Secondary | ICD-10-CM | POA: Diagnosis not present

## 2017-04-06 DIAGNOSIS — Z125 Encounter for screening for malignant neoplasm of prostate: Secondary | ICD-10-CM | POA: Diagnosis not present

## 2017-04-06 DIAGNOSIS — Z1211 Encounter for screening for malignant neoplasm of colon: Secondary | ICD-10-CM | POA: Diagnosis not present

## 2017-04-06 DIAGNOSIS — Z299 Encounter for prophylactic measures, unspecified: Secondary | ICD-10-CM | POA: Diagnosis not present

## 2017-04-06 DIAGNOSIS — E7800 Pure hypercholesterolemia, unspecified: Secondary | ICD-10-CM | POA: Diagnosis not present

## 2017-04-06 DIAGNOSIS — Z0000 Encounter for general adult medical examination without abnormal findings: Secondary | ICD-10-CM | POA: Diagnosis not present

## 2017-04-06 DIAGNOSIS — R5383 Other fatigue: Secondary | ICD-10-CM | POA: Diagnosis not present

## 2017-04-06 DIAGNOSIS — Z79899 Other long term (current) drug therapy: Secondary | ICD-10-CM | POA: Diagnosis not present

## 2017-04-06 DIAGNOSIS — Z1339 Encounter for screening examination for other mental health and behavioral disorders: Secondary | ICD-10-CM | POA: Diagnosis not present

## 2017-04-06 DIAGNOSIS — Z7189 Other specified counseling: Secondary | ICD-10-CM | POA: Diagnosis not present

## 2017-04-06 DIAGNOSIS — Z6830 Body mass index (BMI) 30.0-30.9, adult: Secondary | ICD-10-CM | POA: Diagnosis not present

## 2017-04-06 DIAGNOSIS — Z1331 Encounter for screening for depression: Secondary | ICD-10-CM | POA: Diagnosis not present

## 2017-04-20 DIAGNOSIS — I10 Essential (primary) hypertension: Secondary | ICD-10-CM | POA: Diagnosis not present

## 2017-04-20 DIAGNOSIS — Z299 Encounter for prophylactic measures, unspecified: Secondary | ICD-10-CM | POA: Diagnosis not present

## 2017-04-20 DIAGNOSIS — Z713 Dietary counseling and surveillance: Secondary | ICD-10-CM | POA: Diagnosis not present

## 2017-04-20 DIAGNOSIS — Z6830 Body mass index (BMI) 30.0-30.9, adult: Secondary | ICD-10-CM | POA: Diagnosis not present

## 2017-04-20 DIAGNOSIS — R05 Cough: Secondary | ICD-10-CM | POA: Diagnosis not present

## 2017-04-20 DIAGNOSIS — J209 Acute bronchitis, unspecified: Secondary | ICD-10-CM | POA: Diagnosis not present

## 2017-05-04 DIAGNOSIS — Z2821 Immunization not carried out because of patient refusal: Secondary | ICD-10-CM | POA: Diagnosis not present

## 2017-05-04 DIAGNOSIS — E1165 Type 2 diabetes mellitus with hyperglycemia: Secondary | ICD-10-CM | POA: Diagnosis not present

## 2017-05-04 DIAGNOSIS — I10 Essential (primary) hypertension: Secondary | ICD-10-CM | POA: Diagnosis not present

## 2017-05-04 DIAGNOSIS — R339 Retention of urine, unspecified: Secondary | ICD-10-CM | POA: Diagnosis not present

## 2017-05-04 DIAGNOSIS — Z6829 Body mass index (BMI) 29.0-29.9, adult: Secondary | ICD-10-CM | POA: Diagnosis not present

## 2017-05-04 DIAGNOSIS — Z299 Encounter for prophylactic measures, unspecified: Secondary | ICD-10-CM | POA: Diagnosis not present

## 2017-05-04 DIAGNOSIS — N400 Enlarged prostate without lower urinary tract symptoms: Secondary | ICD-10-CM | POA: Diagnosis not present

## 2017-05-05 DIAGNOSIS — E119 Type 2 diabetes mellitus without complications: Secondary | ICD-10-CM | POA: Diagnosis not present

## 2017-05-05 DIAGNOSIS — H52223 Regular astigmatism, bilateral: Secondary | ICD-10-CM | POA: Diagnosis not present

## 2017-05-05 DIAGNOSIS — H5203 Hypermetropia, bilateral: Secondary | ICD-10-CM | POA: Diagnosis not present

## 2017-05-05 DIAGNOSIS — H524 Presbyopia: Secondary | ICD-10-CM | POA: Diagnosis not present

## 2017-05-19 DIAGNOSIS — Z299 Encounter for prophylactic measures, unspecified: Secondary | ICD-10-CM | POA: Diagnosis not present

## 2017-05-19 DIAGNOSIS — Z713 Dietary counseling and surveillance: Secondary | ICD-10-CM | POA: Diagnosis not present

## 2017-05-19 DIAGNOSIS — Z6829 Body mass index (BMI) 29.0-29.9, adult: Secondary | ICD-10-CM | POA: Diagnosis not present

## 2017-05-19 DIAGNOSIS — E1165 Type 2 diabetes mellitus with hyperglycemia: Secondary | ICD-10-CM | POA: Diagnosis not present

## 2017-06-22 DIAGNOSIS — Z789 Other specified health status: Secondary | ICD-10-CM | POA: Diagnosis not present

## 2017-06-22 DIAGNOSIS — Z299 Encounter for prophylactic measures, unspecified: Secondary | ICD-10-CM | POA: Diagnosis not present

## 2017-06-22 DIAGNOSIS — Z6830 Body mass index (BMI) 30.0-30.9, adult: Secondary | ICD-10-CM | POA: Diagnosis not present

## 2017-06-22 DIAGNOSIS — J209 Acute bronchitis, unspecified: Secondary | ICD-10-CM | POA: Diagnosis not present

## 2017-06-22 DIAGNOSIS — B001 Herpesviral vesicular dermatitis: Secondary | ICD-10-CM | POA: Diagnosis not present

## 2017-06-22 DIAGNOSIS — E1165 Type 2 diabetes mellitus with hyperglycemia: Secondary | ICD-10-CM | POA: Diagnosis not present

## 2017-06-29 DIAGNOSIS — E1165 Type 2 diabetes mellitus with hyperglycemia: Secondary | ICD-10-CM | POA: Diagnosis not present

## 2017-06-29 DIAGNOSIS — J069 Acute upper respiratory infection, unspecified: Secondary | ICD-10-CM | POA: Diagnosis not present

## 2017-06-29 DIAGNOSIS — Z789 Other specified health status: Secondary | ICD-10-CM | POA: Diagnosis not present

## 2017-06-29 DIAGNOSIS — E7800 Pure hypercholesterolemia, unspecified: Secondary | ICD-10-CM | POA: Diagnosis not present

## 2017-06-29 DIAGNOSIS — Z299 Encounter for prophylactic measures, unspecified: Secondary | ICD-10-CM | POA: Diagnosis not present

## 2017-06-29 DIAGNOSIS — Z6829 Body mass index (BMI) 29.0-29.9, adult: Secondary | ICD-10-CM | POA: Diagnosis not present

## 2017-07-12 DIAGNOSIS — R05 Cough: Secondary | ICD-10-CM | POA: Diagnosis not present

## 2017-07-12 DIAGNOSIS — K219 Gastro-esophageal reflux disease without esophagitis: Secondary | ICD-10-CM | POA: Diagnosis not present

## 2017-07-12 DIAGNOSIS — R0982 Postnasal drip: Secondary | ICD-10-CM | POA: Diagnosis not present

## 2017-07-15 DIAGNOSIS — E7800 Pure hypercholesterolemia, unspecified: Secondary | ICD-10-CM | POA: Diagnosis not present

## 2017-07-15 DIAGNOSIS — E119 Type 2 diabetes mellitus without complications: Secondary | ICD-10-CM | POA: Diagnosis not present

## 2017-07-15 DIAGNOSIS — I2510 Atherosclerotic heart disease of native coronary artery without angina pectoris: Secondary | ICD-10-CM | POA: Diagnosis not present

## 2017-07-15 DIAGNOSIS — I10 Essential (primary) hypertension: Secondary | ICD-10-CM | POA: Diagnosis not present

## 2017-08-23 DIAGNOSIS — R05 Cough: Secondary | ICD-10-CM | POA: Diagnosis not present

## 2017-08-23 DIAGNOSIS — K219 Gastro-esophageal reflux disease without esophagitis: Secondary | ICD-10-CM | POA: Diagnosis not present

## 2017-08-23 DIAGNOSIS — R0982 Postnasal drip: Secondary | ICD-10-CM | POA: Diagnosis not present

## 2017-09-02 DIAGNOSIS — E7800 Pure hypercholesterolemia, unspecified: Secondary | ICD-10-CM | POA: Diagnosis not present

## 2017-09-02 DIAGNOSIS — Z713 Dietary counseling and surveillance: Secondary | ICD-10-CM | POA: Diagnosis not present

## 2017-09-02 DIAGNOSIS — Z299 Encounter for prophylactic measures, unspecified: Secondary | ICD-10-CM | POA: Diagnosis not present

## 2017-09-02 DIAGNOSIS — E1165 Type 2 diabetes mellitus with hyperglycemia: Secondary | ICD-10-CM | POA: Diagnosis not present

## 2017-09-02 DIAGNOSIS — Z6830 Body mass index (BMI) 30.0-30.9, adult: Secondary | ICD-10-CM | POA: Diagnosis not present

## 2017-10-08 ENCOUNTER — Ambulatory Visit: Payer: Medicare Other | Admitting: Cardiology

## 2017-10-22 ENCOUNTER — Ambulatory Visit: Payer: Medicare Other | Admitting: Cardiology

## 2017-11-16 NOTE — Progress Notes (Signed)
Cardiology Office Note  Date: 11/17/2017   ID: AB LEAMING, DOB [birth year], MRN [id number]  PCP: Joseph Chroman, MD  Primary Cardiologist: Joseph Lesches, MD   Chief Complaint  Patient presents with  . Coronary Artery Disease    History of Present Illness: Joseph Burton is a 72 y.o. male last seen in May 2018.  He presents for a routine visit. Since last evaluation he does not report any significant angina symptoms.  He exercises using a stationary bicycle at home.  States that he has ridden 5 or more miles at a time while watching television.  He reports NYHA class II dyspnea.  No prolonged palpitations or syncope.  I reviewed his current medications.  He had come off regular aspirin, we discussed resuming 81 mg daily in the setting of cardiovascular disease.  He does not use Goody powders with any regularity.  Also continues on atenolol and is now on Crestor instead of gemfibrozil.  He will be having follow-up lipids with his PCP in December.  I personally reviewed his ECG today which shows normal sinus rhythm.  Past Medical History:  Diagnosis Date  . Arthritis   . Coronary artery disease    Multivessel status post CABG 2002  . Early cataracts, bilateral   . Environmental and seasonal allergies   . Fatty liver   . GERD (gastroesophageal reflux disease)   . Hearing loss   . Hypertension   . Sleep apnea   . Type 2 diabetes mellitus ([place])   . Umbilical hernia     Past Surgical History:  Procedure Laterality Date  . CARDIAC CATHETERIZATION     2002  . CHOLECYSTECTOMY     3 years ago  . COLONOSCOPY N/A 10/02/2015   Procedure: COLONOSCOPY;  Surgeon: Rogene Houston, MD;  [facility];  Service: Endoscopy;  Laterality: N/A;  930  . CORONARY ARTERY BYPASS GRAFT  2002   2002  . INSERTION OF MESH N/A 05/29/2016   Procedure: INSERTION OF MESH;  Surgeon: Ralene Ok, MD;  Location: [place];  Service: General;  Laterality: N/A;  . JOINT REPLACEMENT      Left shoulder  . SHOULDER ARTHROSCOPY W/ ROTATOR CUFF REPAIR Left 07/2010  . TONSILLECTOMY  1969  . TREATMENT FISTULA ANAL    . UMBILICAL HERNIA REPAIR N/A 05/29/2016   Procedure: LAPAROSCOPIC UMBILICAL HERNIA;  Surgeon: Ralene Ok, MD;  Location: [place];  Service: General;  Laterality: N/A;    Current Outpatient Medications  Medication Sig Dispense Refill  . Aspirin-Acetaminophen-Caffeine (GOODYS EXTRA STRENGTH) 520-260-32.5 MG PACK Take 1 packet by mouth every 8 (eight) hours as needed (for pain.).    Marland Kitchen atenolol (TENORMIN) 50 MG tablet Take 25 mg by mouth at bedtime.   9  . azelastine (ASTELIN) 0.1 % nasal spray Place 1 spray into both nostrils 2 (two) times daily as needed for rhinitis. Use in each nostril as directed    . fexofenadine (ALLERGY RELIEF) 180 MG tablet Take 180 mg by mouth daily as needed for allergies or rhinitis.    . fluticasone (FLONASE) 50 MCG/ACT nasal spray Place 2 sprays into both nostrils daily as needed for allergies or rhinitis.    Marland Kitchen ipratropium (ATROVENT) 0.06 % nasal spray Place 2 sprays into both nostrils 3 (three) times daily as needed for rhinitis.    . metFORMIN (GLUCOPHAGE-XR) 500 MG 24 hr tablet Take 500 mg by mouth daily with breakfast.     . nabumetone (RELAFEN) 750  MG tablet Take 750 mg by mouth 2 (two) times daily.     . ranitidine (ZANTAC) 150 MG tablet Take 150 mg by mouth 2 (two) times daily.  2  . rosuvastatin (CRESTOR) 5 MG tablet Take 5 mg by mouth daily.  12  . tamsulosin (FLOMAX) 0.4 MG CAPS capsule TAKE ONE CAPSULE BY MOUTH TWICE DAILY - STOP UROXATRAL  4   No current facility-administered medications for this visit.    Allergies:  No known allergies   Social History: The patient  reports that he has never smoked. He has never used smokeless tobacco. He reports that he does not drink alcohol or use drugs.   ROS:  Please see the history of present illness. Otherwise, complete review of systems is positive for mild dizziness sometimes  when he is squatting and stands up quickly..  All other systems are reviewed and negative.   Physical Exam: VS:  BP (!) 142/84   Pulse 78   Ht 5\' 10"  (1.778 m)   Wt 203 lb (92.1 kg)   SpO2 97%   BMI 29.13 kg/m , BMI Body mass index is 29.13 kg/m.  Wt Readings from Last 3 Encounters:  11/17/17 203 lb (92.1 kg)  10/08/16 201 lb (91.2 kg)  05/25/16 204 lb 14.4 oz (92.9 kg)    General: Overweight male, appears comfortable at rest. HEENT: Conjunctiva and lids normal, oropharynx clear. Neck: Supple, no elevated JVP or carotid bruits, no thyromegaly. Lungs: Clear to auscultation, nonlabored breathing at rest. Cardiac: Regular rate and rhythm, no S3, soft systolic murmur. Abdomen: Soft, nontender, bowel sounds present. Extremities: No pitting edema, distal pulses 2+. Skin: Warm and dry. Musculoskeletal: No kyphosis. Neuropsychiatric: Alert and oriented x3, affect grossly appropriate.  ECG: I personally reviewed the tracing from 03/25/2016 which showed sinus rhythm with diffuse nonspecific ST-T wave abnormalities.  Recent Labwork:  05/25/2016: ALT 25; AST 22; BUN 14; Creatinine, Ser 0.83; Hemoglobin 14.4; Platelets 167; Potassium 4.5; Sodium 141   Other Studies Reviewed Today:  Echocardiogram 04/02/2016: Study Conclusions  - Left ventricle: The cavity size was normal. Wall thickness was increased in a pattern of mild LVH. Systolic function was vigorous. The estimated ejection fraction was in the range of 65% to 70%. Wall motion was normal; there were no regional wall motion abnormalities. Features are consistent with a pseudonormal left ventricular filling pattern, with concomitant abnormal relaxation and increased filling pressure (grade 2 diastolic dysfunction). - Mitral valve: There was trivial regurgitation. - Right atrium: Central venous pressure (est): 3 mm Hg. - Tricuspid valve: There was mild regurgitation. - Pulmonary arteries: PA peak pressure: 33 mm Hg  (S). - Pericardium, extracardiac: There was no pericardial effusion.  Impressions:  - Mild LVH with LVEF 65-70%. Grade 2 diastolic dysfunction. Trivial mitral regurgitation. Mild tricuspid regurgitation with PASP 33 mmHg.  Lexiscan Myoview 04/02/2016:  No diagnostic ST segment changes to indicate ischemia.  Small, mild intensity, apical anterior defect that is most consistent with attenuation artifact. No large, reversible perfusion defects to indicate ischemic territories.  This is a low risk study.  Nuclear stress EF: 70%.  Assessment and Plan:  1.  CAD with history of CABG in 2002.  He reports no angina symptoms.  Myoview within the last 2 years was low risk and showed normal LVEF.  Plan is to continue medical therapy.  He will resume aspirin 81 mg daily.  Continue statin.  2.  Mixed hyperlipidemia, now on Crestor per PCP.  He will have follow-up lipids in  December.  3.  Obstructive sleep apnea, on CPAP.  4.  Type 2 diabetes mellitus.  He continues on Glucophage XR and follows with Dr. Woody Seller.  Current medicines were reviewed with the patient today.   Orders Placed This Encounter  Procedures  . EKG 12-Lead    Disposition: Follow-up in 1 year.  Signed, Satira Sark, MD, [facility] 11/17/2017 10:40 AM    [place] [facility] [address], [place], [place] [address] Phone: [phone]; Fax: [phone]

## 2017-11-17 ENCOUNTER — Ambulatory Visit (INDEPENDENT_AMBULATORY_CARE_PROVIDER_SITE_OTHER): Payer: Medicare Other | Admitting: Cardiology

## 2017-11-17 ENCOUNTER — Encounter: Payer: Self-pay | Admitting: Cardiology

## 2017-11-17 ENCOUNTER — Encounter

## 2017-11-17 VITALS — BP 142/84 | HR 78 | Ht 70.0 in | Wt 203.0 lb

## 2017-11-17 DIAGNOSIS — I25119 Atherosclerotic heart disease of native coronary artery with unspecified angina pectoris: Secondary | ICD-10-CM

## 2017-11-17 DIAGNOSIS — E782 Mixed hyperlipidemia: Secondary | ICD-10-CM

## 2017-11-17 DIAGNOSIS — G4733 Obstructive sleep apnea (adult) (pediatric): Secondary | ICD-10-CM | POA: Diagnosis not present

## 2017-11-17 DIAGNOSIS — E1159 Type 2 diabetes mellitus with other circulatory complications: Secondary | ICD-10-CM | POA: Diagnosis not present

## 2017-11-17 DIAGNOSIS — Z9989 Dependence on other enabling machines and devices: Secondary | ICD-10-CM

## 2017-11-17 MED ORDER — ASPIRIN EC 81 MG PO TBEC: 81.0000 mg | DELAYED_RELEASE_TABLET | Freq: Every day | ORAL | 3 refills | Status: DC

## 2017-11-17 NOTE — Patient Instructions (Addendum)
Medication Instructions:   Your physician has recommended you make the following change in your medication:   Start aspirin 81 mg by mouth daily.  Continue all other medications the same.  Labwork:  NONE  Testing/Procedures:  NONE  Follow-Up:  Your physician recommends that you schedule a follow-up appointment in: 1 year. You will receive a reminder letter in the mail in about 10 months reminding you to call and schedule your appointment. If you don't receive this letter, please contact our office.  Any Other Special Instructions Will Be Listed Below (If Applicable).  If you need a refill on your cardiac medications before your next appointment, please call your pharmacy.

## 2017-11-23 DIAGNOSIS — H6123 Impacted cerumen, bilateral: Secondary | ICD-10-CM | POA: Diagnosis not present

## 2017-11-23 DIAGNOSIS — R0982 Postnasal drip: Secondary | ICD-10-CM | POA: Diagnosis not present

## 2017-11-23 DIAGNOSIS — K219 Gastro-esophageal reflux disease without esophagitis: Secondary | ICD-10-CM | POA: Diagnosis not present

## 2017-12-09 DIAGNOSIS — E1165 Type 2 diabetes mellitus with hyperglycemia: Secondary | ICD-10-CM | POA: Diagnosis not present

## 2017-12-09 DIAGNOSIS — Z299 Encounter for prophylactic measures, unspecified: Secondary | ICD-10-CM | POA: Diagnosis not present

## 2017-12-09 DIAGNOSIS — Z6830 Body mass index (BMI) 30.0-30.9, adult: Secondary | ICD-10-CM | POA: Diagnosis not present

## 2017-12-09 DIAGNOSIS — Z713 Dietary counseling and surveillance: Secondary | ICD-10-CM | POA: Diagnosis not present

## 2017-12-09 DIAGNOSIS — I10 Essential (primary) hypertension: Secondary | ICD-10-CM | POA: Diagnosis not present

## 2017-12-15 ENCOUNTER — Encounter (INDEPENDENT_AMBULATORY_CARE_PROVIDER_SITE_OTHER): Payer: Self-pay | Admitting: *Deleted

## 2017-12-15 ENCOUNTER — Encounter (INDEPENDENT_AMBULATORY_CARE_PROVIDER_SITE_OTHER): Payer: Self-pay | Admitting: Internal Medicine

## 2017-12-15 ENCOUNTER — Ambulatory Visit (INDEPENDENT_AMBULATORY_CARE_PROVIDER_SITE_OTHER): Payer: Medicare Other | Admitting: Internal Medicine

## 2017-12-15 VITALS — BP 120/60 | HR 64 | Temp 98.1°F | Ht 69.5 in | Wt 200.1 lb

## 2017-12-15 DIAGNOSIS — I25119 Atherosclerotic heart disease of native coronary artery with unspecified angina pectoris: Secondary | ICD-10-CM

## 2017-12-15 DIAGNOSIS — R140 Abdominal distension (gaseous): Secondary | ICD-10-CM | POA: Diagnosis not present

## 2017-12-15 DIAGNOSIS — K219 Gastro-esophageal reflux disease without esophagitis: Secondary | ICD-10-CM | POA: Diagnosis not present

## 2017-12-15 MED ORDER — PANTOPRAZOLE SODIUM 40 MG PO TBEC: 40.0000 mg | DELAYED_RELEASE_TABLET | Freq: Every day | ORAL | 5 refills | Status: DC

## 2017-12-15 NOTE — Patient Instructions (Signed)
Emptying study.  

## 2017-12-15 NOTE — Progress Notes (Signed)
Subjective:    Patient ID: Joseph Burton, male    DOB: 03-Dec-1945, 72 y.o.   MRN: [id number]  HPI Referred by [facility], Dr. Amalia Hailey , [facility] for GERD. States he has had problems bloating. He feels full all day long. He feels like he has trapped gas under his ribs. Symptoms for 2-3 yrs.  Takes Zantac for this. Appetite is okay. ? Has lost about 8 pounds. He has early satiety. Occasionally has acid reflux.  Diabetic x 3 yrs.  Has a BM x 1 a day.  HA1C last week 6.1  Retired from [employer].   Review of Systems Past Medical History:  Diagnosis Date  . Arthritis   . Coronary artery disease    Multivessel status post CABG 2002  . Early cataracts, bilateral   . Environmental and seasonal allergies   . Fatty liver   . GERD (gastroesophageal reflux disease)   . Hearing loss   . Hypertension   . Sleep apnea   . Type 2 diabetes mellitus ([place])   . Umbilical hernia     Past Surgical History:  Procedure Laterality Date  . CARDIAC CATHETERIZATION     2002  . CHOLECYSTECTOMY     3 years ago  . COLONOSCOPY N/A 10/02/2015   Procedure: COLONOSCOPY;  Surgeon: Rogene Houston, MD;  [facility];  Service: Endoscopy;  Laterality: N/A;  930  . CORONARY ARTERY BYPASS GRAFT  2002   2002  . INSERTION OF MESH N/A 05/29/2016   Procedure: INSERTION OF MESH;  Surgeon: Ralene Ok, MD;  Location: [place];  Service: General;  Laterality: N/A;  . JOINT REPLACEMENT     Left shoulder  . SHOULDER ARTHROSCOPY W/ ROTATOR CUFF REPAIR Left 07/2010  . TONSILLECTOMY  1969  . TREATMENT FISTULA ANAL    . UMBILICAL HERNIA REPAIR N/A 05/29/2016   Procedure: LAPAROSCOPIC UMBILICAL HERNIA;  Surgeon: Ralene Ok, MD;  Location: [place];  Service: General;  Laterality: N/A;    Allergies  Allergen Reactions  . No Known Allergies     Current Outpatient Medications on File Prior to Visit  Medication Sig Dispense Refill  . aspirin EC 81 MG tablet Take 81 mg by mouth  daily.    Marland Kitchen atorvastatin (LIPITOR) 10 MG tablet Take 10 mg by mouth daily.    . cyanocobalamin 1000 MCG tablet Take 1,000 mcg by mouth daily.    . folic acid (FOLVITE) 1 MG tablet Take 1 mg by mouth daily.    . metFORMIN (GLUCOPHAGE-XR) 500 MG 24 hr tablet Take 500 mg by mouth daily with breakfast.     . nabumetone (RELAFEN) 750 MG tablet Take 750 mg by mouth 2 (two) times daily.     Marland Kitchen pyridOXINE (VITAMIN B-6) 100 MG tablet Take 100 mg by mouth daily.    . ranitidine (ZANTAC) 150 MG tablet Take 150 mg by mouth 2 (two) times daily.  2  . Red Yeast Rice Extract 600 MG CAPS Take by mouth.    . tamsulosin (FLOMAX) 0.4 MG CAPS capsule TAKE ONE CAPSULE BY MOUTH TWICE DAILY - STOP UROXATRAL  4  . ipratropium (ATROVENT) 0.06 % nasal spray Place 2 sprays into both nostrils 3 (three) times daily as needed for rhinitis.     No current facility-administered medications on file prior to visit.         Objective:   Physical Exam Blood pressure 120/60, pulse 64, temperature 98.1 F (36.7 C), height 5'  9.5" (1.765 m), weight 200 lb 1.6 oz (90.8 kg). Alert and oriented. Skin warm and dry. Oral mucosa is moist.   . Sclera anicteric, conjunctivae is pink. Thyroid not enlarged. No cervical lymphadenopathy. Lungs clear. Heart regular rate and rhythm.  Abdomen is soft. Bowel sounds are positive. No hepatomegaly. No abdominal masses felt. No tenderness.  No edema to lower extremities.           Assessment & Plan:  Early satiety. Bloating.  Am going to get an emptying study. Rx for Protonix sent to his pharmacy.

## 2017-12-20 ENCOUNTER — Encounter (HOSPITAL_COMMUNITY)
Admission: RE | Admit: 2017-12-20 | Discharge: 2017-12-20 | Disposition: A | Discharge status: Home / Self Care | Payer: Medicare Other | Source: Ambulatory Visit | Attending: Internal Medicine | Admitting: Internal Medicine

## 2017-12-20 DIAGNOSIS — R140 Abdominal distension (gaseous): Secondary | ICD-10-CM | POA: Diagnosis not present

## 2017-12-20 DIAGNOSIS — R110 Nausea: Secondary | ICD-10-CM | POA: Diagnosis not present

## 2017-12-20 MED ORDER — TECHNETIUM TC 99M SULFUR COLLOID
2.0000 | Freq: Once | INTRAVENOUS | Status: AC | PRN
  Administered 2017-12-20: 2 via INTRAVENOUS

## 2017-12-29 ENCOUNTER — Telehealth (INDEPENDENT_AMBULATORY_CARE_PROVIDER_SITE_OTHER): Payer: Self-pay | Admitting: *Deleted

## 2017-12-29 NOTE — Telephone Encounter (Signed)
Patient called and states that he his returning the call that he had rec'd  About recent results.

## 2017-12-30 NOTE — Telephone Encounter (Signed)
Joseph Burton - Patient called and left a message asking for a return call . He is wanting to know about his results. [phone]

## 2017-12-30 NOTE — Telephone Encounter (Signed)
I have attempted to call him. Will call again

## 2017-12-30 NOTE — Telephone Encounter (Signed)
Results left on answering machine 

## 2018-01-03 ENCOUNTER — Telehealth (INDEPENDENT_AMBULATORY_CARE_PROVIDER_SITE_OTHER): Payer: Self-pay | Admitting: Internal Medicine

## 2018-01-03 NOTE — Telephone Encounter (Signed)
Joseph Burton  , I know that you have been doing phone tag. Please call this morning.

## 2018-01-03 NOTE — Telephone Encounter (Signed)
Patient would like a call back regarding his test results - cell# [phone]

## 2018-01-04 NOTE — Telephone Encounter (Signed)
Have called patient and will attempt to call later today.

## 2018-01-04 NOTE — Telephone Encounter (Signed)
Results given to patient. He feels better with the Protonix. Would like to hold off on EGd. We discussed and will see back in 8 weeks. If he relapses, will proceed with an EGD. He was satisfied with his care.     Mitzie, OV in 8 weeks.

## 2018-01-12 DIAGNOSIS — H04123 Dry eye syndrome of bilateral lacrimal glands: Secondary | ICD-10-CM | POA: Diagnosis not present

## 2018-01-12 DIAGNOSIS — H40033 Anatomical narrow angle, bilateral: Secondary | ICD-10-CM | POA: Diagnosis not present

## 2018-02-10 DIAGNOSIS — Z6829 Body mass index (BMI) 29.0-29.9, adult: Secondary | ICD-10-CM | POA: Diagnosis not present

## 2018-02-10 DIAGNOSIS — I10 Essential (primary) hypertension: Secondary | ICD-10-CM | POA: Diagnosis not present

## 2018-02-10 DIAGNOSIS — I2510 Atherosclerotic heart disease of native coronary artery without angina pectoris: Secondary | ICD-10-CM | POA: Diagnosis not present

## 2018-02-10 DIAGNOSIS — K602 Anal fissure, unspecified: Secondary | ICD-10-CM | POA: Diagnosis not present

## 2018-02-10 DIAGNOSIS — Z2821 Immunization not carried out because of patient refusal: Secondary | ICD-10-CM | POA: Diagnosis not present

## 2018-02-10 DIAGNOSIS — Z299 Encounter for prophylactic measures, unspecified: Secondary | ICD-10-CM | POA: Diagnosis not present

## 2018-02-17 DIAGNOSIS — E7800 Pure hypercholesterolemia, unspecified: Secondary | ICD-10-CM | POA: Diagnosis not present

## 2018-02-17 DIAGNOSIS — I10 Essential (primary) hypertension: Secondary | ICD-10-CM | POA: Diagnosis not present

## 2018-02-17 DIAGNOSIS — E119 Type 2 diabetes mellitus without complications: Secondary | ICD-10-CM | POA: Diagnosis not present

## 2018-02-17 DIAGNOSIS — I2510 Atherosclerotic heart disease of native coronary artery without angina pectoris: Secondary | ICD-10-CM | POA: Diagnosis not present

## 2018-03-15 DIAGNOSIS — I10 Essential (primary) hypertension: Secondary | ICD-10-CM | POA: Diagnosis not present

## 2018-03-15 DIAGNOSIS — E1165 Type 2 diabetes mellitus with hyperglycemia: Secondary | ICD-10-CM | POA: Diagnosis not present

## 2018-03-15 DIAGNOSIS — Z6830 Body mass index (BMI) 30.0-30.9, adult: Secondary | ICD-10-CM | POA: Diagnosis not present

## 2018-03-15 DIAGNOSIS — Z299 Encounter for prophylactic measures, unspecified: Secondary | ICD-10-CM | POA: Diagnosis not present

## 2018-03-16 ENCOUNTER — Ambulatory Visit (INDEPENDENT_AMBULATORY_CARE_PROVIDER_SITE_OTHER): Payer: Medicare Other | Admitting: Internal Medicine

## 2018-03-16 ENCOUNTER — Encounter (INDEPENDENT_AMBULATORY_CARE_PROVIDER_SITE_OTHER): Payer: Self-pay | Admitting: Internal Medicine

## 2018-03-16 ENCOUNTER — Encounter (INDEPENDENT_AMBULATORY_CARE_PROVIDER_SITE_OTHER): Payer: Self-pay | Admitting: *Deleted

## 2018-03-16 VITALS — BP 140/60 | HR 60 | Temp 97.6°F | Ht 69.5 in | Wt 203.5 lb

## 2018-03-16 DIAGNOSIS — I25119 Atherosclerotic heart disease of native coronary artery with unspecified angina pectoris: Secondary | ICD-10-CM

## 2018-03-16 DIAGNOSIS — K219 Gastro-esophageal reflux disease without esophagitis: Secondary | ICD-10-CM | POA: Diagnosis not present

## 2018-03-16 DIAGNOSIS — R140 Abdominal distension (gaseous): Secondary | ICD-10-CM

## 2018-03-16 HISTORY — DX: Abdominal distension (gaseous): R14.0

## 2018-03-16 NOTE — Patient Instructions (Signed)
The risks of bleeding, perforation and infection were reviewed with patient.  

## 2018-03-16 NOTE — Progress Notes (Signed)
Subjective:    Patient ID: Joseph Burton, male    DOB: 1946/02/03, 72 y.o.   MRN: [id number]  HPI Here today for f/u. Last seen in July of this year as a new patient. C/o bloating.  Had symptoms for about 2-3 yrs.  He was started on Protonix. He still has bloating and burping. His appetite is good. He burps a couple of hours after he eats.  He sometimes has a burning in his epigastric region.  Emptying study in August of 2019 was normal.  He would like to proceed with an EGD.  BMs are normal. No melena or BRRB.   Review of Systems Past Medical History:  Diagnosis Date  . Arthritis   . Bloating 03/16/2018  . Coronary artery disease    Multivessel status post CABG 2002  . Early cataracts, bilateral   . Environmental and seasonal allergies   . Fatty liver   . GERD (gastroesophageal reflux disease)   . Hearing loss   . Hypertension   . Sleep apnea   . Type 2 diabetes mellitus ([place])   . Umbilical hernia     Past Surgical History:  Procedure Laterality Date  . CARDIAC CATHETERIZATION     2002  . CHOLECYSTECTOMY     3 years ago  . COLONOSCOPY N/A 10/02/2015   Procedure: COLONOSCOPY;  Surgeon: Rogene Houston, MD;  [facility];  Service: Endoscopy;  Laterality: N/A;  930  . CORONARY ARTERY BYPASS GRAFT  2002   2002  . INSERTION OF MESH N/A 05/29/2016   Procedure: INSERTION OF MESH;  Surgeon: Ralene Ok, MD;  Location: [place];  Service: General;  Laterality: N/A;  . JOINT REPLACEMENT     Left shoulder  . SHOULDER ARTHROSCOPY W/ ROTATOR CUFF REPAIR Left 07/2010  . TONSILLECTOMY  1969  . TREATMENT FISTULA ANAL    . UMBILICAL HERNIA REPAIR N/A 05/29/2016   Procedure: LAPAROSCOPIC UMBILICAL HERNIA;  Surgeon: Ralene Ok, MD;  Location: [place];  Service: General;  Laterality: N/A;    Allergies  Allergen Reactions  . No Known Allergies     Current Outpatient Medications on File Prior to Visit  Medication Sig Dispense Refill  . aspirin EC 81 MG tablet  Take 81 mg by mouth daily.    Marland Kitchen atenolol (TENORMIN) 25 MG tablet Take by mouth daily.    Marland Kitchen atorvastatin (LIPITOR) 10 MG tablet Take 10 mg by mouth daily.    . cyanocobalamin 1000 MCG tablet Take 1,000 mcg by mouth daily.    . folic acid (FOLVITE) 1 MG tablet Take 1 mg by mouth daily.    Marland Kitchen ipratropium (ATROVENT) 0.06 % nasal spray Place 2 sprays into both nostrils 3 (three) times daily as needed for rhinitis.    . metFORMIN (GLUCOPHAGE-XR) 500 MG 24 hr tablet Take 500 mg by mouth daily with breakfast.     . nabumetone (RELAFEN) 750 MG tablet Take 750 mg by mouth 2 (two) times daily.     . pantoprazole (PROTONIX) 40 MG tablet Take 1 tablet (40 mg total) by mouth daily. 30 tablet 5  . pyridOXINE (VITAMIN B-6) 100 MG tablet Take 100 mg by mouth daily.    . Red Yeast Rice Extract 600 MG CAPS Take by mouth.    . tamsulosin (FLOMAX) 0.4 MG CAPS capsule TAKE ONE CAPSULE BY MOUTH TWICE DAILY - STOP UROXATRAL  4   No current facility-administered medications on file prior to visit.  Objective:   Physical Exam Blood pressure 140/60, pulse 60, temperature 97.6 F (36.4 C), height 5' 9.5" (1.765 m), weight 203 lb 8 oz (92.3 kg). Alert and oriented. Skin warm and dry. Oral mucosa is moist.   . Sclera anicteric, conjunctivae is pink. Thyroid not enlarged. No cervical lymphadenopathy. Lungs clear. Heart regular rate and rhythm.  Abdomen is soft. Bowel sounds are positive. No hepatomegaly. No abdominal masses felt. No tenderness.  No edema to lower extremities.         Assessment & Plan:  Bloating. GERD. Continue the Protonix. EGD. The risks of bleeding, perforation and infection were reviewed with patient.

## 2018-03-17 DIAGNOSIS — K219 Gastro-esophageal reflux disease without esophagitis: Secondary | ICD-10-CM | POA: Insufficient documentation

## 2018-03-18 DIAGNOSIS — I2510 Atherosclerotic heart disease of native coronary artery without angina pectoris: Secondary | ICD-10-CM | POA: Diagnosis not present

## 2018-03-18 DIAGNOSIS — E119 Type 2 diabetes mellitus without complications: Secondary | ICD-10-CM | POA: Diagnosis not present

## 2018-03-18 DIAGNOSIS — E7800 Pure hypercholesterolemia, unspecified: Secondary | ICD-10-CM | POA: Diagnosis not present

## 2018-03-18 DIAGNOSIS — I10 Essential (primary) hypertension: Secondary | ICD-10-CM | POA: Diagnosis not present

## 2018-03-28 ENCOUNTER — Encounter (HOSPITAL_COMMUNITY): Payer: Self-pay

## 2018-03-28 ENCOUNTER — Other Ambulatory Visit: Payer: Self-pay

## 2018-03-28 ENCOUNTER — Ambulatory Visit (HOSPITAL_COMMUNITY)
Admission: RE | Admit: 2018-03-28 | Discharge: 2018-03-28 | Disposition: A | Discharge status: Home / Self Care | Payer: Medicare Other | Source: Ambulatory Visit | Attending: Internal Medicine | Admitting: Internal Medicine

## 2018-03-28 ENCOUNTER — Encounter (HOSPITAL_COMMUNITY)
Admission: RE | Disposition: A | Discharge status: Home / Self Care | Payer: Self-pay | Source: Ambulatory Visit | Attending: Internal Medicine

## 2018-03-28 DIAGNOSIS — I10 Essential (primary) hypertension: Secondary | ICD-10-CM | POA: Insufficient documentation

## 2018-03-28 DIAGNOSIS — G4730 Sleep apnea, unspecified: Secondary | ICD-10-CM | POA: Diagnosis not present

## 2018-03-28 DIAGNOSIS — Z96612 Presence of left artificial shoulder joint: Secondary | ICD-10-CM | POA: Diagnosis not present

## 2018-03-28 DIAGNOSIS — Z800 Family history of malignant neoplasm of digestive organs: Secondary | ICD-10-CM | POA: Diagnosis not present

## 2018-03-28 DIAGNOSIS — M1990 Unspecified osteoarthritis, unspecified site: Secondary | ICD-10-CM | POA: Diagnosis not present

## 2018-03-28 DIAGNOSIS — Z7984 Long term (current) use of oral hypoglycemic drugs: Secondary | ICD-10-CM | POA: Insufficient documentation

## 2018-03-28 DIAGNOSIS — H9190 Unspecified hearing loss, unspecified ear: Secondary | ICD-10-CM | POA: Diagnosis not present

## 2018-03-28 DIAGNOSIS — Z7982 Long term (current) use of aspirin: Secondary | ICD-10-CM | POA: Insufficient documentation

## 2018-03-28 DIAGNOSIS — Z8249 Family history of ischemic heart disease and other diseases of the circulatory system: Secondary | ICD-10-CM | POA: Insufficient documentation

## 2018-03-28 DIAGNOSIS — K228 Other specified diseases of esophagus: Secondary | ICD-10-CM | POA: Diagnosis not present

## 2018-03-28 DIAGNOSIS — I2510 Atherosclerotic heart disease of native coronary artery without angina pectoris: Secondary | ICD-10-CM | POA: Diagnosis not present

## 2018-03-28 DIAGNOSIS — Z9049 Acquired absence of other specified parts of digestive tract: Secondary | ICD-10-CM | POA: Insufficient documentation

## 2018-03-28 DIAGNOSIS — K219 Gastro-esophageal reflux disease without esophagitis: Secondary | ICD-10-CM | POA: Diagnosis not present

## 2018-03-28 DIAGNOSIS — R140 Abdominal distension (gaseous): Secondary | ICD-10-CM | POA: Diagnosis not present

## 2018-03-28 DIAGNOSIS — Z8042 Family history of malignant neoplasm of prostate: Secondary | ICD-10-CM | POA: Insufficient documentation

## 2018-03-28 DIAGNOSIS — Z79899 Other long term (current) drug therapy: Secondary | ICD-10-CM | POA: Diagnosis not present

## 2018-03-28 DIAGNOSIS — E119 Type 2 diabetes mellitus without complications: Secondary | ICD-10-CM | POA: Insufficient documentation

## 2018-03-28 DIAGNOSIS — Z951 Presence of aortocoronary bypass graft: Secondary | ICD-10-CM | POA: Insufficient documentation

## 2018-03-28 DIAGNOSIS — Z820 Family history of epilepsy and other diseases of the nervous system: Secondary | ICD-10-CM | POA: Diagnosis not present

## 2018-03-28 DIAGNOSIS — K760 Fatty (change of) liver, not elsewhere classified: Secondary | ICD-10-CM | POA: Insufficient documentation

## 2018-03-28 HISTORY — PX: ESOPHAGOGASTRODUODENOSCOPY: SHX5428

## 2018-03-28 SURGERY — EGD (ESOPHAGOGASTRODUODENOSCOPY)
Anesthesia: Moderate Sedation

## 2018-03-28 MED ORDER — MEPERIDINE HCL 50 MG/ML IJ SOLN
INTRAMUSCULAR | Status: DC | PRN
  Administered 2018-03-28 (×2): 25 mg

## 2018-03-28 MED ORDER — SODIUM CHLORIDE 0.9 % IV SOLN
INTRAVENOUS | Status: DC
  Administered 2018-03-28: 12:00:00 via INTRAVENOUS

## 2018-03-28 MED ORDER — MIDAZOLAM HCL 5 MG/5ML IJ SOLN
INTRAMUSCULAR | Status: DC | PRN
  Administered 2018-03-28 (×2): 2 mg via INTRAVENOUS

## 2018-03-28 MED ORDER — MEPERIDINE HCL 50 MG/ML IJ SOLN
INTRAMUSCULAR | Status: AC
  Filled 2018-03-28: qty 1

## 2018-03-28 MED ORDER — METRONIDAZOLE 250 MG PO TABS: 250.0000 mg | ORAL_TABLET | Freq: Three times a day (TID) | ORAL | 0 refills | Status: DC

## 2018-03-28 MED ORDER — LIDOCAINE VISCOUS HCL 2 % MT SOLN
OROMUCOSAL | Status: DC | PRN
  Administered 2018-03-28: 1 via OROMUCOSAL

## 2018-03-28 MED ORDER — STERILE WATER FOR IRRIGATION IR SOLN
Status: DC | PRN
  Administered 2018-03-28: 1.5 mL

## 2018-03-28 MED ORDER — LIDOCAINE VISCOUS HCL 2 % MT SOLN
OROMUCOSAL | Status: AC
  Filled 2018-03-28: qty 15

## 2018-03-28 MED ORDER — MIDAZOLAM HCL 5 MG/5ML IJ SOLN
INTRAMUSCULAR | Status: AC
  Filled 2018-03-28: qty 10

## 2018-03-28 NOTE — Op Note (Signed)
[facility] Patient Name: Joseph Burton Procedure Date: 03/28/2018 12:35 PM MRN: [id number] Date of Birth: [birth year] Attending MD: Hildred Laser , MD CSN: [id number] Age: 72 Admit Type: Outpatient Procedure:                Upper GI endoscopy Indications:              Follow-up of gastro-esophageal reflux disease,                            Abdominal bloating Providers:                Hildred Laser, MD, Lurline Del, RN, Gerome Sam,                            RN Referring MD:             Glenda Chroman, MD Medicines:                Lidocaine spray, Meperidine 50 mg IV, Midazolam 4                            mg IV Complications:            No immediate complications. Estimated Blood Loss:     Estimated blood loss: none. Procedure:                Pre-Anesthesia Assessment:                           - Prior to the procedure, a History and Physical                            was performed, and patient medications and                            allergies were reviewed. The patient's tolerance of                            previous anesthesia was also reviewed. The risks                            and benefits of the procedure and the sedation                            options and risks were discussed with the patient.                            All questions were answered, and informed consent                            was obtained. Prior Anticoagulants: The patient                            last took aspirin 7 days and previous NSAID  medication 1 day prior to the procedure. ASA Grade                            Assessment: III - A patient with severe systemic                            disease. After reviewing the risks and benefits,                            the patient was deemed in satisfactory condition to                            undergo the procedure.                           After obtaining informed consent, the endoscope was                    passed under direct vision. Throughout the                            procedure, the patient's blood pressure, pulse, and                            oxygen saturations were monitored continuously. The                            [id number]-[id number] ([id number]) scope was introduced through the                            mouth, and advanced to the second part of duodenum.                            The upper GI endoscopy was accomplished without                            difficulty. The patient tolerated the procedure                            well. Scope In: 12:51:34 PM Scope Out: 45:40:98 PM Total Procedure Duration: 0 hours 5 minutes 17 seconds  Findings:      The examined esophagus was normal.      The Z-line was irregular and was found 42 cm from the incisors.      The entire examined stomach was normal.      The duodenal bulb and second portion of the duodenum were normal. Impression:               - Normal esophagus.                           - Z-line irregular, 42 cm from the incisors.                           - Normal stomach.                           -  Normal duodenal bulb and second portion of the                            duodenum.                           - No specimens collected. Moderate Sedation:      Moderate (conscious) sedation was administered by the endoscopy nurse       and supervised by the endoscopist. The following parameters were       monitored: oxygen saturation, heart rate, blood pressure, CO2       capnography and response to care. Total physician intraservice time was       11 minutes. Recommendation:           - Patient has a contact number available for                            emergencies. The signs and symptoms of potential                            delayed complications were discussed with the                            patient. Return to normal activities tomorrow.                            Written discharge instructions were provided to  the                            patient.                           - Resume previous diet today.                           - Continue present medications.                           - Metronidazole 250 mg po tid for 10 days.                           - Telephone GI clinic in 2 weeks. Procedure Code(s):        --- Professional ---                           [phone], Esophagogastroduodenoscopy, flexible,                            transoral; diagnostic, including collection of                            specimen(s) by brushing or washing, when performed                            (separate procedure)  G0500, Moderate sedation services provided by the                            same physician or other qualified health care                            professional performing a gastrointestinal                            endoscopic service that sedation supports,                            requiring the presence of an independent trained                            observer to assist in the monitoring of the                            patient's level of consciousness and physiological                            status; initial 15 minutes of intra-service time;                            patient age 29 years or older (additional time may                            be reported with [phone], as appropriate) Diagnosis Code(s):        --- Professional ---                           K22.8, Other specified diseases of esophagus                           K21.9, Gastro-esophageal reflux disease without                            esophagitis                           R14.0, Abdominal distension (gaseous) CPT copyright 2018 American Medical Association. All rights reserved. The codes documented in this report are preliminary and upon coder review may  be revised to meet current compliance requirements. Hildred Laser, MD Hildred Laser, MD 03/28/2018 1:07:59 PM This report has been signed  electronically. Number of Addenda: 0

## 2018-03-28 NOTE — Discharge Instructions (Signed)
Resume usual medications including aspirin as before. Please note as Aleve/naproxen deleted from the list of your medications. Metronidazole 250 mg by mouth after each meal for 10 days.  Keep symptom diary and call office for any refill his prescription. Resume usual diet. No driving for 24 hours.  Esophagogastroduodenoscopy, Care After Refer to this sheet in the next few weeks. These instructions provide you with information about caring for yourself after your procedure. Your health care provider may also give you more specific instructions. Your treatment has been planned according to current medical practices, but problems sometimes occur. Call your health care provider if you have any problems or questions after your procedure. Dr Laural Golden; [phone]; after hours and weekends, call the hospital and have the GI doctor on call paged.  They will call you back. What can I expect after the procedure? After the procedure, it is common to have:  A sore throat.  Bloating.  Dizziness.  Fatigue.  Follow these instructions at home:  Do not eat or drink anything until the numbing medicine (local anesthetic) has worn off and your gag reflex has returned. You will know that the local anesthetic has worn off when you can swallow comfortably.  Do not drive for 24 hours if you received a medicine to help you relax (sedative).  If your health care provider took a tissue sample for testing during the procedure, make sure to get your test results. This is your responsibility. Ask your health care provider or the department performing the test when your results will be ready.  Keep all follow-up visits as told by your health care provider. This is important. Contact a health care provider if:  You cannot stop coughing.  You are not urinating.  You are urinating less than usual. Get help right away if:  You have trouble swallowing.  You have throat or chest pain that gets worse.  You have  nausea or vomiting.  You have a fever.  You have severe abdominal pain.  You have black, tarry, or bloody stools. This information is not intended to replace advice given to you by your health care provider. Make sure you discuss any questions you have with your health care provider. Document Released: 04/20/2012 Document Revised: 10/10/2015 Document Reviewed: 03/28/2015 Elsevier Interactive Patient Education  [employer].

## 2018-03-28 NOTE — H&P (Addendum)
Joseph Burton is an 72 y.o. male.   Chief Complaint: Patient is here for EGD. HPI: Patient is 72 year old Caucasian male who presents with few months history of bloating and frequent burping.  He also has been having intermittent heartburn no more than once or twice a week.  He did not feel any better with this ranitidine.  He was also complaining of early satiety.  Patient is diabetic.  Gastric emptying study was performed and was within normal limits.  Patient was switched to pantoprazole.  He thought was helping but symptoms of relapse.  Denies nausea vomiting dysphagia abdominal pain melena or frank rectal bleeding.  He may have blood on tissue at times.  He has been using OTC simethicone which has helped some. Patient is on Relafen.  Past Medical History:  Diagnosis Date  . Arthritis   . Bloating 03/16/2018  . Coronary artery disease    Multivessel status post CABG 2002  . Early cataracts, bilateral   . Environmental and seasonal allergies   . Fatty liver   . GERD (gastroesophageal reflux disease)   . Hearing loss   . Hypertension   . Sleep apnea   . Type 2 diabetes mellitus ([place])   . Umbilical hernia     Past Surgical History:  Procedure Laterality Date  . CARDIAC CATHETERIZATION     2002  . CHOLECYSTECTOMY     3 years ago  . COLONOSCOPY N/A 10/02/2015   Procedure: COLONOSCOPY;  Surgeon: Rogene Houston, MD;  [facility];  Service: Endoscopy;  Laterality: N/A;  930  . CORONARY ARTERY BYPASS GRAFT  2002   2002  . INSERTION OF MESH N/A 05/29/2016   Procedure: INSERTION OF MESH;  Surgeon: Ralene Ok, MD;  Location: [facility];  Service: General;  Laterality: N/A;  . JOINT REPLACEMENT     Left shoulder  . SHOULDER ARTHROSCOPY W/ ROTATOR CUFF REPAIR Left 07/2010  . TONSILLECTOMY  1969  . TREATMENT FISTULA ANAL    . UMBILICAL HERNIA REPAIR N/A 05/29/2016   Procedure: LAPAROSCOPIC UMBILICAL HERNIA;  Surgeon: Ralene Ok, MD;  Location: [facility];  Service:  General;  Laterality: N/A;    Family History  Problem Relation Age of Onset  . Colon cancer Father   . Heart disease Father   . Hypertension Father   . Melanoma Father   . Prostate cancer Father   . Alzheimer's disease Mother   . Heart disease Sister   . Heart disease Brother    Social History:  reports that he has never smoked. He has never used smokeless tobacco. He reports that he does not drink alcohol or use drugs.  Allergies:  Allergies  Allergen Reactions  . No Known Allergies     Medications Prior to Admission  Medication Sig Dispense Refill  . aspirin EC 81 MG tablet Take 81 mg by mouth daily.    . Aspirin-Acetaminophen-Caffeine (GOODYS EXTRA STRENGTH) [phone] MG PACK Take 1 packet by mouth daily as needed (for pain/headaches.).    Marland Kitchen atenolol (TENORMIN) 50 MG tablet Take 25 mg by mouth at bedtime.  12  . atorvastatin (LIPITOR) 10 MG tablet Take 10 mg by mouth at bedtime.     . Calcium Carb-Cholecalciferol (CALCIUM 600+D3 PO) Take 1 tablet by mouth at bedtime.    . carboxymethylcellulose (REFRESH PLUS) 0.5 % SOLN Place 1 drop into both eyes 3 (three) times daily as needed (for dry/irritated/watery eyes.).    Marland Kitchen folic acid (FOLVITE) 893 MCG tablet Take  400 mcg by mouth daily.    Marland Kitchen ipratropium (ATROVENT) 0.06 % nasal spray Place 2 sprays into both nostrils 3 (three) times daily as needed (NASAL CONGESTION/ALLERGIES.).     Marland Kitchen metFORMIN (GLUCOPHAGE-XR) 500 MG 24 hr tablet Take 500 mg by mouth at bedtime.     . nabumetone (RELAFEN) 750 MG tablet Take 750 mg by mouth 2 (two) times daily.     . naproxen sodium (ALEVE) 220 MG tablet Take 220 mg by mouth 2 (two) times daily as needed (for pain/headaches.).    Marland Kitchen pantoprazole (PROTONIX) 40 MG tablet Take 1 tablet (40 mg total) by mouth daily. 30 tablet 5  . pyridOXINE (VITAMIN B-6) 100 MG tablet Take 100 mg by mouth daily.    . Red Yeast Rice Extract 600 MG CAPS Take 600 mg by mouth 2 (two) times daily.     . tamsulosin (FLOMAX)  0.4 MG CAPS capsule Take 0.4 mg by mouth 2 (two) times daily.   4  . cyanocobalamin 1000 MCG tablet Take 1,000 mcg by mouth daily.      No results found for this or any previous visit (from the past 48 hour(s)). No results found.  ROS  Blood pressure (!) 141/77, pulse 69, temperature 97.6 F (36.4 C), temperature source Oral, resp. rate 16, SpO2 99 %. Physical Exam  Constitutional: He appears well-developed and well-nourished.  HENT:  Mouth/Throat: Oropharynx is clear and moist.  Eyes: Conjunctivae are normal. No scleral icterus.  Neck: No thyromegaly present.  Cardiovascular: Normal rate, regular rhythm and normal heart sounds.  No murmur heard. Respiratory: Effort normal and breath sounds normal.  GI:  Abdomen is full.  Small supraumbilical scar noted along with scar in epigastric region(related to CABG) abdomen is soft and nontender with organomegaly or masses.  Musculoskeletal: He exhibits no edema.  Lymphadenopathy:    He has no cervical adenopathy.  Neurological: He is alert.  Skin: Skin is warm and dry.     Assessment/Plan Bloating and frequent burping. NSAID use. Diagnostic EGD.  Hildred Laser, MD 03/28/2018, 12:38 PM

## 2018-03-31 LAB — GLUCOSE, CAPILLARY: GLUCOSE-CAPILLARY: 133 mg/dL — AB (ref 70–99)

## 2018-04-01 ENCOUNTER — Encounter (HOSPITAL_COMMUNITY): Payer: Self-pay | Admitting: Internal Medicine

## 2018-04-12 ENCOUNTER — Telehealth (INDEPENDENT_AMBULATORY_CARE_PROVIDER_SITE_OTHER): Payer: Self-pay | Admitting: Internal Medicine

## 2018-04-12 NOTE — Telephone Encounter (Signed)
Patient called in his progress report - states he is doing pretty good - still having a little belching and bloating but not as bad as before.

## 2018-04-13 NOTE — Telephone Encounter (Signed)
This Progress report will be shared with Dr.Rehman.

## 2018-04-28 DIAGNOSIS — I2510 Atherosclerotic heart disease of native coronary artery without angina pectoris: Secondary | ICD-10-CM | POA: Diagnosis not present

## 2018-04-28 DIAGNOSIS — E7800 Pure hypercholesterolemia, unspecified: Secondary | ICD-10-CM | POA: Diagnosis not present

## 2018-04-28 DIAGNOSIS — I10 Essential (primary) hypertension: Secondary | ICD-10-CM | POA: Diagnosis not present

## 2018-04-28 DIAGNOSIS — E119 Type 2 diabetes mellitus without complications: Secondary | ICD-10-CM | POA: Diagnosis not present

## 2018-05-04 DIAGNOSIS — Z125 Encounter for screening for malignant neoplasm of prostate: Secondary | ICD-10-CM | POA: Diagnosis not present

## 2018-05-04 DIAGNOSIS — I10 Essential (primary) hypertension: Secondary | ICD-10-CM | POA: Diagnosis not present

## 2018-05-04 DIAGNOSIS — Z299 Encounter for prophylactic measures, unspecified: Secondary | ICD-10-CM | POA: Diagnosis not present

## 2018-05-04 DIAGNOSIS — E7800 Pure hypercholesterolemia, unspecified: Secondary | ICD-10-CM | POA: Diagnosis not present

## 2018-05-04 DIAGNOSIS — Z7189 Other specified counseling: Secondary | ICD-10-CM | POA: Diagnosis not present

## 2018-05-04 DIAGNOSIS — Z1331 Encounter for screening for depression: Secondary | ICD-10-CM | POA: Diagnosis not present

## 2018-05-04 DIAGNOSIS — Z0000 Encounter for general adult medical examination without abnormal findings: Secondary | ICD-10-CM | POA: Diagnosis not present

## 2018-05-04 DIAGNOSIS — E1165 Type 2 diabetes mellitus with hyperglycemia: Secondary | ICD-10-CM | POA: Diagnosis not present

## 2018-05-04 DIAGNOSIS — Z1339 Encounter for screening examination for other mental health and behavioral disorders: Secondary | ICD-10-CM | POA: Diagnosis not present

## 2018-05-04 DIAGNOSIS — Z1211 Encounter for screening for malignant neoplasm of colon: Secondary | ICD-10-CM | POA: Diagnosis not present

## 2018-05-04 DIAGNOSIS — R5383 Other fatigue: Secondary | ICD-10-CM | POA: Diagnosis not present

## 2018-05-04 DIAGNOSIS — Z6831 Body mass index (BMI) 31.0-31.9, adult: Secondary | ICD-10-CM | POA: Diagnosis not present

## 2018-05-04 DIAGNOSIS — Z79899 Other long term (current) drug therapy: Secondary | ICD-10-CM | POA: Diagnosis not present

## 2018-05-09 DIAGNOSIS — J00 Acute nasopharyngitis [common cold]: Secondary | ICD-10-CM | POA: Diagnosis not present

## 2018-05-09 DIAGNOSIS — R05 Cough: Secondary | ICD-10-CM | POA: Diagnosis not present

## 2018-05-26 DIAGNOSIS — E7800 Pure hypercholesterolemia, unspecified: Secondary | ICD-10-CM | POA: Diagnosis not present

## 2018-05-26 DIAGNOSIS — I2510 Atherosclerotic heart disease of native coronary artery without angina pectoris: Secondary | ICD-10-CM | POA: Diagnosis not present

## 2018-05-26 DIAGNOSIS — E119 Type 2 diabetes mellitus without complications: Secondary | ICD-10-CM | POA: Diagnosis not present

## 2018-05-26 DIAGNOSIS — I10 Essential (primary) hypertension: Secondary | ICD-10-CM | POA: Diagnosis not present

## 2018-06-03 ENCOUNTER — Other Ambulatory Visit (INDEPENDENT_AMBULATORY_CARE_PROVIDER_SITE_OTHER): Payer: Self-pay | Admitting: Internal Medicine

## 2018-06-03 DIAGNOSIS — K219 Gastro-esophageal reflux disease without esophagitis: Secondary | ICD-10-CM

## 2018-06-22 DIAGNOSIS — E7800 Pure hypercholesterolemia, unspecified: Secondary | ICD-10-CM | POA: Diagnosis not present

## 2018-06-22 DIAGNOSIS — E119 Type 2 diabetes mellitus without complications: Secondary | ICD-10-CM | POA: Diagnosis not present

## 2018-06-22 DIAGNOSIS — I10 Essential (primary) hypertension: Secondary | ICD-10-CM | POA: Diagnosis not present

## 2018-06-22 DIAGNOSIS — I2510 Atherosclerotic heart disease of native coronary artery without angina pectoris: Secondary | ICD-10-CM | POA: Diagnosis not present

## 2018-06-23 DIAGNOSIS — I10 Essential (primary) hypertension: Secondary | ICD-10-CM | POA: Diagnosis not present

## 2018-06-23 DIAGNOSIS — Z6831 Body mass index (BMI) 31.0-31.9, adult: Secondary | ICD-10-CM | POA: Diagnosis not present

## 2018-06-23 DIAGNOSIS — Z299 Encounter for prophylactic measures, unspecified: Secondary | ICD-10-CM | POA: Diagnosis not present

## 2018-06-23 DIAGNOSIS — E1165 Type 2 diabetes mellitus with hyperglycemia: Secondary | ICD-10-CM | POA: Diagnosis not present

## 2018-07-08 DIAGNOSIS — Z299 Encounter for prophylactic measures, unspecified: Secondary | ICD-10-CM | POA: Diagnosis not present

## 2018-07-08 DIAGNOSIS — Z6831 Body mass index (BMI) 31.0-31.9, adult: Secondary | ICD-10-CM | POA: Diagnosis not present

## 2018-07-08 DIAGNOSIS — I10 Essential (primary) hypertension: Secondary | ICD-10-CM | POA: Diagnosis not present

## 2018-07-08 DIAGNOSIS — E1165 Type 2 diabetes mellitus with hyperglycemia: Secondary | ICD-10-CM | POA: Diagnosis not present

## 2018-07-08 DIAGNOSIS — K529 Noninfective gastroenteritis and colitis, unspecified: Secondary | ICD-10-CM | POA: Diagnosis not present

## 2018-07-08 DIAGNOSIS — K219 Gastro-esophageal reflux disease without esophagitis: Secondary | ICD-10-CM | POA: Diagnosis not present

## 2018-08-24 DIAGNOSIS — E119 Type 2 diabetes mellitus without complications: Secondary | ICD-10-CM | POA: Diagnosis not present

## 2018-08-24 DIAGNOSIS — I10 Essential (primary) hypertension: Secondary | ICD-10-CM | POA: Diagnosis not present

## 2018-08-24 DIAGNOSIS — E7800 Pure hypercholesterolemia, unspecified: Secondary | ICD-10-CM | POA: Diagnosis not present

## 2018-08-24 DIAGNOSIS — I2510 Atherosclerotic heart disease of native coronary artery without angina pectoris: Secondary | ICD-10-CM | POA: Diagnosis not present

## 2018-09-30 DIAGNOSIS — E1159 Type 2 diabetes mellitus with other circulatory complications: Secondary | ICD-10-CM | POA: Diagnosis not present

## 2018-09-30 DIAGNOSIS — Z299 Encounter for prophylactic measures, unspecified: Secondary | ICD-10-CM | POA: Diagnosis not present

## 2018-09-30 DIAGNOSIS — Z6831 Body mass index (BMI) 31.0-31.9, adult: Secondary | ICD-10-CM | POA: Diagnosis not present

## 2018-09-30 DIAGNOSIS — I2510 Atherosclerotic heart disease of native coronary artery without angina pectoris: Secondary | ICD-10-CM | POA: Diagnosis not present

## 2018-09-30 DIAGNOSIS — E1165 Type 2 diabetes mellitus with hyperglycemia: Secondary | ICD-10-CM | POA: Diagnosis not present

## 2018-09-30 DIAGNOSIS — I10 Essential (primary) hypertension: Secondary | ICD-10-CM | POA: Diagnosis not present

## 2018-10-14 DIAGNOSIS — E119 Type 2 diabetes mellitus without complications: Secondary | ICD-10-CM | POA: Diagnosis not present

## 2018-10-14 DIAGNOSIS — I2510 Atherosclerotic heart disease of native coronary artery without angina pectoris: Secondary | ICD-10-CM | POA: Diagnosis not present

## 2018-10-14 DIAGNOSIS — I10 Essential (primary) hypertension: Secondary | ICD-10-CM | POA: Diagnosis not present

## 2018-10-14 DIAGNOSIS — E7800 Pure hypercholesterolemia, unspecified: Secondary | ICD-10-CM | POA: Diagnosis not present

## 2018-11-16 DIAGNOSIS — E7800 Pure hypercholesterolemia, unspecified: Secondary | ICD-10-CM | POA: Diagnosis not present

## 2018-11-16 DIAGNOSIS — I10 Essential (primary) hypertension: Secondary | ICD-10-CM | POA: Diagnosis not present

## 2018-11-16 DIAGNOSIS — I2510 Atherosclerotic heart disease of native coronary artery without angina pectoris: Secondary | ICD-10-CM | POA: Diagnosis not present

## 2018-11-16 DIAGNOSIS — E119 Type 2 diabetes mellitus without complications: Secondary | ICD-10-CM | POA: Diagnosis not present

## 2018-12-05 ENCOUNTER — Other Ambulatory Visit (INDEPENDENT_AMBULATORY_CARE_PROVIDER_SITE_OTHER): Payer: Self-pay | Admitting: Internal Medicine

## 2018-12-05 DIAGNOSIS — K219 Gastro-esophageal reflux disease without esophagitis: Secondary | ICD-10-CM

## 2018-12-13 DIAGNOSIS — H35033 Hypertensive retinopathy, bilateral: Secondary | ICD-10-CM | POA: Diagnosis not present

## 2018-12-13 DIAGNOSIS — H2513 Age-related nuclear cataract, bilateral: Secondary | ICD-10-CM | POA: Diagnosis not present

## 2018-12-13 DIAGNOSIS — H25013 Cortical age-related cataract, bilateral: Secondary | ICD-10-CM | POA: Diagnosis not present

## 2018-12-13 DIAGNOSIS — H35371 Puckering of macula, right eye: Secondary | ICD-10-CM | POA: Diagnosis not present

## 2018-12-13 DIAGNOSIS — E119 Type 2 diabetes mellitus without complications: Secondary | ICD-10-CM | POA: Diagnosis not present

## 2019-01-09 DIAGNOSIS — E1165 Type 2 diabetes mellitus with hyperglycemia: Secondary | ICD-10-CM | POA: Diagnosis not present

## 2019-01-09 DIAGNOSIS — Z6830 Body mass index (BMI) 30.0-30.9, adult: Secondary | ICD-10-CM | POA: Diagnosis not present

## 2019-01-09 DIAGNOSIS — I10 Essential (primary) hypertension: Secondary | ICD-10-CM | POA: Diagnosis not present

## 2019-01-09 DIAGNOSIS — E1159 Type 2 diabetes mellitus with other circulatory complications: Secondary | ICD-10-CM | POA: Diagnosis not present

## 2019-01-09 DIAGNOSIS — I2510 Atherosclerotic heart disease of native coronary artery without angina pectoris: Secondary | ICD-10-CM | POA: Diagnosis not present

## 2019-01-09 DIAGNOSIS — Z299 Encounter for prophylactic measures, unspecified: Secondary | ICD-10-CM | POA: Diagnosis not present

## 2019-01-26 ENCOUNTER — Ambulatory Visit: Payer: Medicare Other | Admitting: Cardiology

## 2019-01-26 DIAGNOSIS — E7800 Pure hypercholesterolemia, unspecified: Secondary | ICD-10-CM | POA: Diagnosis not present

## 2019-01-26 DIAGNOSIS — I2510 Atherosclerotic heart disease of native coronary artery without angina pectoris: Secondary | ICD-10-CM | POA: Diagnosis not present

## 2019-01-26 DIAGNOSIS — I10 Essential (primary) hypertension: Secondary | ICD-10-CM | POA: Diagnosis not present

## 2019-02-14 DIAGNOSIS — E1165 Type 2 diabetes mellitus with hyperglycemia: Secondary | ICD-10-CM | POA: Diagnosis not present

## 2019-02-24 DIAGNOSIS — E7800 Pure hypercholesterolemia, unspecified: Secondary | ICD-10-CM | POA: Diagnosis not present

## 2019-02-24 DIAGNOSIS — I2510 Atherosclerotic heart disease of native coronary artery without angina pectoris: Secondary | ICD-10-CM | POA: Diagnosis not present

## 2019-02-24 DIAGNOSIS — I10 Essential (primary) hypertension: Secondary | ICD-10-CM | POA: Diagnosis not present

## 2019-02-27 DIAGNOSIS — I10 Essential (primary) hypertension: Secondary | ICD-10-CM | POA: Diagnosis not present

## 2019-02-27 DIAGNOSIS — L820 Inflamed seborrheic keratosis: Secondary | ICD-10-CM | POA: Diagnosis not present

## 2019-02-27 DIAGNOSIS — Z6830 Body mass index (BMI) 30.0-30.9, adult: Secondary | ICD-10-CM | POA: Diagnosis not present

## 2019-02-27 DIAGNOSIS — Z299 Encounter for prophylactic measures, unspecified: Secondary | ICD-10-CM | POA: Diagnosis not present

## 2019-03-13 ENCOUNTER — Other Ambulatory Visit: Payer: Self-pay

## 2019-03-13 ENCOUNTER — Ambulatory Visit (INDEPENDENT_AMBULATORY_CARE_PROVIDER_SITE_OTHER): Payer: Medicare Other | Admitting: Cardiology

## 2019-03-13 ENCOUNTER — Encounter: Payer: Self-pay | Admitting: Cardiology

## 2019-03-13 VITALS — BP 118/78 | HR 80 | Ht 69.5 in | Wt 206.0 lb

## 2019-03-13 DIAGNOSIS — E119 Type 2 diabetes mellitus without complications: Secondary | ICD-10-CM | POA: Diagnosis not present

## 2019-03-13 DIAGNOSIS — E782 Mixed hyperlipidemia: Secondary | ICD-10-CM

## 2019-03-13 DIAGNOSIS — Z9989 Dependence on other enabling machines and devices: Secondary | ICD-10-CM | POA: Diagnosis not present

## 2019-03-13 DIAGNOSIS — G4733 Obstructive sleep apnea (adult) (pediatric): Secondary | ICD-10-CM

## 2019-03-13 DIAGNOSIS — I25119 Atherosclerotic heart disease of native coronary artery with unspecified angina pectoris: Secondary | ICD-10-CM | POA: Diagnosis not present

## 2019-03-13 NOTE — Progress Notes (Signed)
Cardiology Office Note  Date: 03/13/2019   ID: Joseph Burton, DOB [birth year], MRN [id number]  PCP:  Glenda Chroman, MD  Cardiologist:  Rozann Lesches, MD Electrophysiologist:  None   Chief Complaint  Patient presents with  . Cardiac follow-up    History of Present Illness: Joseph Burton is a 73 y.o. male last seen in July 2019.  He presents for a routine follow-up visit.  He does not report any active angina at this time on medical therapy, no nitroglycerin use.  Still using his stationary bicycle for exercise at home.  He reports NYHA class II dyspnea, no palpitations or syncope.  I personally reviewed his ECG today which shows sinus rhythm with nonspecific ST-T wave abnormalities.  I reviewed his medications which are stable from a cardiac perspective and outlined below.  He will be having a physical with PCP in December.  Last ischemic testing was in 2017 as outlined below.  Past Medical History:  Diagnosis Date  . Arthritis   . Bloating 03/16/2018  . Coronary artery disease    Multivessel status post CABG 2002  . Early cataracts, bilateral   . Environmental and seasonal allergies   . Fatty liver   . GERD (gastroesophageal reflux disease)   . Hearing loss   . Hypertension   . Sleep apnea   . Type 2 diabetes mellitus ([place])   . Umbilical hernia     Past Surgical History:  Procedure Laterality Date  . CARDIAC CATHETERIZATION     2002  . CHOLECYSTECTOMY     3 years ago  . COLONOSCOPY N/A 10/02/2015   Procedure: COLONOSCOPY;  Surgeon: Rogene Houston, MD;  [facility];  Service: Endoscopy;  Laterality: N/A;  930  . CORONARY ARTERY BYPASS GRAFT  2002   2002  . ESOPHAGOGASTRODUODENOSCOPY N/A 03/28/2018   Procedure: ESOPHAGOGASTRODUODENOSCOPY (EGD);  Surgeon: Rogene Houston, MD;  [facility];  Service: Endoscopy;  Laterality: N/A;  2:20  . INSERTION OF MESH N/A 05/29/2016   Procedure: INSERTION OF MESH;  Surgeon: Ralene Ok, MD;  Location: [facility];  Service: General;  Laterality: N/A;  . JOINT REPLACEMENT     Left shoulder  . SHOULDER ARTHROSCOPY W/ ROTATOR CUFF REPAIR Left 07/2010  . TONSILLECTOMY  1969  . TREATMENT FISTULA ANAL    . UMBILICAL HERNIA REPAIR N/A 05/29/2016   Procedure: LAPAROSCOPIC UMBILICAL HERNIA;  Surgeon: Ralene Ok, MD;  Location: [facility];  Service: General;  Laterality: N/A;    Current Outpatient Medications  Medication Sig Dispense Refill  . aspirin EC 81 MG tablet Take 81 mg by mouth daily.    . Aspirin-Acetaminophen-Caffeine (GOODYS EXTRA STRENGTH) [phone] MG PACK Take 1 packet by mouth daily as needed (for pain/headaches.).    Marland Kitchen atenolol (TENORMIN) 50 MG tablet Take 25 mg by mouth at bedtime.  12  . atorvastatin (LIPITOR) 10 MG tablet Take 10 mg by mouth at bedtime.     . Calcium Carb-Cholecalciferol (CALCIUM 600+D3 PO) Take 1 tablet by mouth at bedtime.    . carboxymethylcellulose (REFRESH PLUS) 0.5 % SOLN Place 1 drop into both eyes 3 (three) times daily as needed (for dry/irritated/watery eyes.).    Marland Kitchen cyanocobalamin 1000 MCG tablet Take 1,000 mcg by mouth daily.    . folic acid (FOLVITE) [id number] MCG tablet Take 400 mcg by mouth daily.    Marland Kitchen ipratropium (ATROVENT) 0.06 % nasal spray Place 2 sprays into both nostrils 3 (three) times daily as  needed (NASAL CONGESTION/ALLERGIES.).     Marland Kitchen metFORMIN (GLUCOPHAGE-XR) 500 MG 24 hr tablet Take 500 mg by mouth at bedtime.     . metroNIDAZOLE (FLAGYL) 250 MG tablet Take 1 tablet (250 mg total) by mouth 3 (three) times daily. 30 tablet 0  . nabumetone (RELAFEN) 750 MG tablet Take 750 mg by mouth 2 (two) times daily.     . pantoprazole (PROTONIX) 40 MG tablet TAKE 1 TABLET BY MOUTH EVERY DAY 30 tablet 11  . pyridOXINE (VITAMIN B-6) 100 MG tablet Take 100 mg by mouth daily.    . Red Yeast Rice Extract 600 MG CAPS Take 600 mg by mouth 2 (two) times daily.     . tamsulosin (FLOMAX) 0.4 MG CAPS capsule Take 0.4 mg by mouth 2 (two) times  daily.   4   No current facility-administered medications for this visit.    Allergies:  No known allergies   Social History: The patient  reports that he has never smoked. He has never used smokeless tobacco. He reports that he does not drink alcohol or use drugs.   ROS:  Please see the history of present illness. Otherwise, complete review of systems is positive for arthritic pains.  All other systems are reviewed and negative.   Physical Exam: VS:  BP 118/78   Pulse 80   Ht 5' 9.5" (1.765 m)   Wt 206 lb (93.4 kg)   SpO2 98%   BMI 29.98 kg/m , BMI Body mass index is 29.98 kg/m.  Wt Readings from Last 3 Encounters:  03/13/19 206 lb (93.4 kg)  03/16/18 203 lb 8 oz (92.3 kg)  12/15/17 200 lb 1.6 oz (90.8 kg)    General: Patient appears comfortable at rest. HEENT: Conjunctiva and lids normal, wearing a mask. Neck: Supple, no elevated JVP or carotid bruits, no thyromegaly. Lungs: Clear to auscultation, nonlabored breathing at rest. Cardiac: Regular rate and rhythm, no S3, soft systolic murmur. Abdomen: Soft, nontender, bowel sounds present. Extremities: No pitting edema, distal pulses 2+. Skin: Warm and dry. Musculoskeletal: No kyphosis. Neuropsychiatric: Alert and oriented x3, affect grossly appropriate.  ECG:  An ECG dated 11/17/2017 was personally reviewed today and demonstrated:  Normal sinus rhythm.  Recent Labwork:  05/25/2016: ALT 25; AST 22; BUN 14; Creatinine, Ser 0.83; Hemoglobin 14.4; Platelets 167; Potassium 4.5; Sodium 141  Other Studies Reviewed Today:  Echocardiogram11/16/2017: Study Conclusions  - Left ventricle: The cavity size was normal. Wall thickness was increased in a pattern of mild LVH. Systolic function was vigorous. The estimated ejection fraction was in the range of 65% to 70%. Wall motion was normal; there were no regional wall motion abnormalities. Features are consistent with a pseudonormal left ventricular filling pattern, with  concomitant abnormal relaxation and increased filling pressure (grade 2 diastolic dysfunction). - Mitral valve: There was trivial regurgitation. - Right atrium: Central venous pressure (est): 3 mm Hg. - Tricuspid valve: There was mild regurgitation. - Pulmonary arteries: PA peak pressure: 33 mm Hg (S). - Pericardium, extracardiac: There was no pericardial effusion.  Impressions:  - Mild LVH with LVEF 65-70%. Grade 2 diastolic dysfunction. Trivial mitral regurgitation. Mild tricuspid regurgitation with PASP 33 mmHg.  Lexiscan Myoview 04/02/2016:  No diagnostic ST segment changes to indicate ischemia.  Small, mild intensity, apical anterior defect that is most consistent with attenuation artifact. No large, reversible perfusion defects to indicate ischemic territories.  This is a low risk study.  Nuclear stress EF: 70%.  Assessment and Plan:  1.  CAD  status post CABG in 2002.  He does not report any active angina and underwent ischemic testing 3 years ago.  I reviewed his ECG which is stable.  We will continue with present medical therapy and observation, he remains on aspirin, beta-blocker, and statin.  2.  Mixed hyperlipidemia, currently on Lipitor.  He is due for follow-up lab work with PCP and physical later this year.  Goal LDL should be under 70.  3.  OSA on CPAP.  4.  Type 2 diabetes mellitus.  He continues to follow with Dr. Helyn Numbers and remains on Glucophage XR.  He is exercising using a stationary bicycle.  Medication Adjustments/Labs and Tests Ordered: Current medicines are reviewed at length with the patient today.  Concerns regarding medicines are outlined above.   Tests Ordered: Orders Placed This Encounter  Procedures  . EKG 12-Lead    Medication Changes: No orders of the defined types were placed in this encounter.   Disposition:  Follow up 1 year in the [place] office.  Signed, Satira Sark, MD, [facility] 03/13/2019 3:56 PM    [facility] [place], [place], [place] [address] Phone: [phone]; Fax: [phone]

## 2019-03-13 NOTE — Patient Instructions (Signed)

## 2019-03-15 DIAGNOSIS — E1165 Type 2 diabetes mellitus with hyperglycemia: Secondary | ICD-10-CM | POA: Diagnosis not present

## 2019-04-19 DIAGNOSIS — Z299 Encounter for prophylactic measures, unspecified: Secondary | ICD-10-CM | POA: Diagnosis not present

## 2019-04-19 DIAGNOSIS — E1165 Type 2 diabetes mellitus with hyperglycemia: Secondary | ICD-10-CM | POA: Diagnosis not present

## 2019-04-19 DIAGNOSIS — I2510 Atherosclerotic heart disease of native coronary artery without angina pectoris: Secondary | ICD-10-CM | POA: Diagnosis not present

## 2019-04-19 DIAGNOSIS — I10 Essential (primary) hypertension: Secondary | ICD-10-CM | POA: Diagnosis not present

## 2019-04-19 DIAGNOSIS — Z6829 Body mass index (BMI) 29.0-29.9, adult: Secondary | ICD-10-CM | POA: Diagnosis not present

## 2019-05-02 DIAGNOSIS — E1165 Type 2 diabetes mellitus with hyperglycemia: Secondary | ICD-10-CM | POA: Diagnosis not present

## 2019-05-04 DIAGNOSIS — E7800 Pure hypercholesterolemia, unspecified: Secondary | ICD-10-CM | POA: Diagnosis not present

## 2019-05-04 DIAGNOSIS — I2510 Atherosclerotic heart disease of native coronary artery without angina pectoris: Secondary | ICD-10-CM | POA: Diagnosis not present

## 2019-05-04 DIAGNOSIS — I10 Essential (primary) hypertension: Secondary | ICD-10-CM | POA: Diagnosis not present

## 2019-05-15 DIAGNOSIS — Z7189 Other specified counseling: Secondary | ICD-10-CM | POA: Diagnosis not present

## 2019-05-15 DIAGNOSIS — Z1339 Encounter for screening examination for other mental health and behavioral disorders: Secondary | ICD-10-CM | POA: Diagnosis not present

## 2019-05-15 DIAGNOSIS — Z299 Encounter for prophylactic measures, unspecified: Secondary | ICD-10-CM | POA: Diagnosis not present

## 2019-05-15 DIAGNOSIS — Z1211 Encounter for screening for malignant neoplasm of colon: Secondary | ICD-10-CM | POA: Diagnosis not present

## 2019-05-15 DIAGNOSIS — E1165 Type 2 diabetes mellitus with hyperglycemia: Secondary | ICD-10-CM | POA: Diagnosis not present

## 2019-05-15 DIAGNOSIS — Z1331 Encounter for screening for depression: Secondary | ICD-10-CM | POA: Diagnosis not present

## 2019-05-15 DIAGNOSIS — I10 Essential (primary) hypertension: Secondary | ICD-10-CM | POA: Diagnosis not present

## 2019-05-15 DIAGNOSIS — Z0000 Encounter for general adult medical examination without abnormal findings: Secondary | ICD-10-CM | POA: Diagnosis not present

## 2019-05-15 DIAGNOSIS — R5383 Other fatigue: Secondary | ICD-10-CM | POA: Diagnosis not present

## 2019-05-15 DIAGNOSIS — Z6831 Body mass index (BMI) 31.0-31.9, adult: Secondary | ICD-10-CM | POA: Diagnosis not present

## 2019-05-15 DIAGNOSIS — E7800 Pure hypercholesterolemia, unspecified: Secondary | ICD-10-CM | POA: Diagnosis not present

## 2019-05-17 ENCOUNTER — Telehealth: Payer: Self-pay | Admitting: *Deleted

## 2019-05-17 NOTE — Telephone Encounter (Signed)
-----   Message from Satira Sark, MD sent at 05/17/2019 12:41 PM EST ----- Results reviewed.  Lab work done recently per Dr. Woody Seller.  LDL was 100.  He has been on Lipitor 10 mg daily.  See if he would be willing to increase this to 20 mg daily.

## 2019-05-23 NOTE — Telephone Encounter (Signed)
-----   Message from Satira Sark, MD sent at 05/17/2019 12:41 PM EST ----- Results reviewed.  Lab work done recently per Dr. Woody Seller.  LDL was 100.  He has been on Lipitor 10 mg daily.  See if he would be willing to increase this to 20 mg daily.

## 2019-05-26 MED ORDER — ATORVASTATIN CALCIUM 20 MG PO TABS: 20.0000 mg | ORAL_TABLET | Freq: Every day | ORAL | 3 refills | Status: DC

## 2019-05-26 NOTE — Telephone Encounter (Signed)
Patient informed and agrees to increase lipitor to 20 mg daily. Rx sent to [facility] Copy sent to PCP

## 2019-05-26 NOTE — Telephone Encounter (Signed)
-----   Message from Satira Sark, MD sent at 05/17/2019 12:41 PM EST ----- Results reviewed.  Lab work done recently per Dr. Woody Seller.  LDL was 100.  He has been on Lipitor 10 mg daily.  See if he would be willing to increase this to 20 mg daily.

## 2019-05-31 DIAGNOSIS — E7800 Pure hypercholesterolemia, unspecified: Secondary | ICD-10-CM | POA: Diagnosis not present

## 2019-05-31 DIAGNOSIS — I10 Essential (primary) hypertension: Secondary | ICD-10-CM | POA: Diagnosis not present

## 2019-05-31 DIAGNOSIS — I2510 Atherosclerotic heart disease of native coronary artery without angina pectoris: Secondary | ICD-10-CM | POA: Diagnosis not present

## 2019-06-15 DIAGNOSIS — E1165 Type 2 diabetes mellitus with hyperglycemia: Secondary | ICD-10-CM | POA: Diagnosis not present

## 2019-06-29 DIAGNOSIS — Z23 Encounter for immunization: Secondary | ICD-10-CM | POA: Diagnosis not present

## 2019-07-16 DIAGNOSIS — E1165 Type 2 diabetes mellitus with hyperglycemia: Secondary | ICD-10-CM | POA: Diagnosis not present

## 2019-07-24 DIAGNOSIS — I10 Essential (primary) hypertension: Secondary | ICD-10-CM | POA: Diagnosis not present

## 2019-07-24 DIAGNOSIS — Z299 Encounter for prophylactic measures, unspecified: Secondary | ICD-10-CM | POA: Diagnosis not present

## 2019-07-24 DIAGNOSIS — E1159 Type 2 diabetes mellitus with other circulatory complications: Secondary | ICD-10-CM | POA: Diagnosis not present

## 2019-07-24 DIAGNOSIS — E1165 Type 2 diabetes mellitus with hyperglycemia: Secondary | ICD-10-CM | POA: Diagnosis not present

## 2019-08-15 DIAGNOSIS — E1165 Type 2 diabetes mellitus with hyperglycemia: Secondary | ICD-10-CM | POA: Diagnosis not present

## 2019-08-16 DIAGNOSIS — I10 Essential (primary) hypertension: Secondary | ICD-10-CM | POA: Diagnosis not present

## 2019-08-16 DIAGNOSIS — I2510 Atherosclerotic heart disease of native coronary artery without angina pectoris: Secondary | ICD-10-CM | POA: Diagnosis not present

## 2019-08-16 DIAGNOSIS — E7800 Pure hypercholesterolemia, unspecified: Secondary | ICD-10-CM | POA: Diagnosis not present

## 2019-09-15 DIAGNOSIS — E1165 Type 2 diabetes mellitus with hyperglycemia: Secondary | ICD-10-CM | POA: Diagnosis not present

## 2019-10-15 DIAGNOSIS — E1165 Type 2 diabetes mellitus with hyperglycemia: Secondary | ICD-10-CM | POA: Diagnosis not present

## 2019-10-31 DIAGNOSIS — Z299 Encounter for prophylactic measures, unspecified: Secondary | ICD-10-CM | POA: Diagnosis not present

## 2019-10-31 DIAGNOSIS — Z6829 Body mass index (BMI) 29.0-29.9, adult: Secondary | ICD-10-CM | POA: Diagnosis not present

## 2019-10-31 DIAGNOSIS — E1165 Type 2 diabetes mellitus with hyperglycemia: Secondary | ICD-10-CM | POA: Diagnosis not present

## 2019-10-31 DIAGNOSIS — I10 Essential (primary) hypertension: Secondary | ICD-10-CM | POA: Diagnosis not present

## 2019-10-31 DIAGNOSIS — Z713 Dietary counseling and surveillance: Secondary | ICD-10-CM | POA: Diagnosis not present

## 2019-11-15 DIAGNOSIS — I10 Essential (primary) hypertension: Secondary | ICD-10-CM | POA: Diagnosis not present

## 2019-11-15 DIAGNOSIS — I2510 Atherosclerotic heart disease of native coronary artery without angina pectoris: Secondary | ICD-10-CM | POA: Diagnosis not present

## 2019-11-15 DIAGNOSIS — E1165 Type 2 diabetes mellitus with hyperglycemia: Secondary | ICD-10-CM | POA: Diagnosis not present

## 2019-11-15 DIAGNOSIS — E7800 Pure hypercholesterolemia, unspecified: Secondary | ICD-10-CM | POA: Diagnosis not present

## 2019-12-07 ENCOUNTER — Telehealth (INDEPENDENT_AMBULATORY_CARE_PROVIDER_SITE_OTHER): Payer: Self-pay | Admitting: Gastroenterology

## 2019-12-07 DIAGNOSIS — K219 Gastro-esophageal reflux disease without esophagitis: Secondary | ICD-10-CM

## 2019-12-07 MED ORDER — PANTOPRAZOLE SODIUM 40 MG PO TBEC: 40.0000 mg | DELAYED_RELEASE_TABLET | Freq: Every day | ORAL | 1 refills | Status: DC

## 2019-12-07 NOTE — Telephone Encounter (Signed)
Refill sent to pharmacy per request. He is overdue for OV - last seen 2019.

## 2019-12-14 DIAGNOSIS — H2513 Age-related nuclear cataract, bilateral: Secondary | ICD-10-CM | POA: Diagnosis not present

## 2019-12-14 DIAGNOSIS — H04123 Dry eye syndrome of bilateral lacrimal glands: Secondary | ICD-10-CM | POA: Diagnosis not present

## 2019-12-14 DIAGNOSIS — E119 Type 2 diabetes mellitus without complications: Secondary | ICD-10-CM | POA: Diagnosis not present

## 2019-12-14 DIAGNOSIS — H35033 Hypertensive retinopathy, bilateral: Secondary | ICD-10-CM | POA: Diagnosis not present

## 2019-12-15 DIAGNOSIS — E1165 Type 2 diabetes mellitus with hyperglycemia: Secondary | ICD-10-CM | POA: Diagnosis not present

## 2019-12-15 DIAGNOSIS — I2510 Atherosclerotic heart disease of native coronary artery without angina pectoris: Secondary | ICD-10-CM | POA: Diagnosis not present

## 2019-12-15 DIAGNOSIS — I10 Essential (primary) hypertension: Secondary | ICD-10-CM | POA: Diagnosis not present

## 2019-12-15 DIAGNOSIS — E7800 Pure hypercholesterolemia, unspecified: Secondary | ICD-10-CM | POA: Diagnosis not present

## 2019-12-29 DIAGNOSIS — H6123 Impacted cerumen, bilateral: Secondary | ICD-10-CM | POA: Diagnosis not present

## 2019-12-29 DIAGNOSIS — R0982 Postnasal drip: Secondary | ICD-10-CM | POA: Diagnosis not present

## 2020-01-03 DIAGNOSIS — E7800 Pure hypercholesterolemia, unspecified: Secondary | ICD-10-CM | POA: Diagnosis not present

## 2020-01-03 DIAGNOSIS — I10 Essential (primary) hypertension: Secondary | ICD-10-CM | POA: Diagnosis not present

## 2020-01-03 DIAGNOSIS — I2510 Atherosclerotic heart disease of native coronary artery without angina pectoris: Secondary | ICD-10-CM | POA: Diagnosis not present

## 2020-01-16 DIAGNOSIS — E1165 Type 2 diabetes mellitus with hyperglycemia: Secondary | ICD-10-CM | POA: Diagnosis not present

## 2020-02-04 ENCOUNTER — Other Ambulatory Visit (INDEPENDENT_AMBULATORY_CARE_PROVIDER_SITE_OTHER): Payer: Self-pay | Admitting: Gastroenterology

## 2020-02-04 DIAGNOSIS — K219 Gastro-esophageal reflux disease without esophagitis: Secondary | ICD-10-CM

## 2020-02-12 DIAGNOSIS — I10 Essential (primary) hypertension: Secondary | ICD-10-CM | POA: Diagnosis not present

## 2020-02-12 DIAGNOSIS — E1165 Type 2 diabetes mellitus with hyperglycemia: Secondary | ICD-10-CM | POA: Diagnosis not present

## 2020-02-12 DIAGNOSIS — Z299 Encounter for prophylactic measures, unspecified: Secondary | ICD-10-CM | POA: Diagnosis not present

## 2020-02-12 DIAGNOSIS — L97909 Non-pressure chronic ulcer of unspecified part of unspecified lower leg with unspecified severity: Secondary | ICD-10-CM | POA: Diagnosis not present

## 2020-02-15 ENCOUNTER — Ambulatory Visit (INDEPENDENT_AMBULATORY_CARE_PROVIDER_SITE_OTHER): Payer: Medicare Other | Admitting: Gastroenterology

## 2020-02-15 ENCOUNTER — Encounter (INDEPENDENT_AMBULATORY_CARE_PROVIDER_SITE_OTHER): Payer: Self-pay | Admitting: Gastroenterology

## 2020-02-15 ENCOUNTER — Other Ambulatory Visit: Payer: Self-pay

## 2020-02-15 VITALS — BP 135/75 | HR 74 | Temp 96.9°F | Ht 69.5 in | Wt 191.7 lb

## 2020-02-15 DIAGNOSIS — E7800 Pure hypercholesterolemia, unspecified: Secondary | ICD-10-CM | POA: Diagnosis not present

## 2020-02-15 DIAGNOSIS — K219 Gastro-esophageal reflux disease without esophagitis: Secondary | ICD-10-CM | POA: Diagnosis not present

## 2020-02-15 DIAGNOSIS — R140 Abdominal distension (gaseous): Secondary | ICD-10-CM

## 2020-02-15 DIAGNOSIS — I2510 Atherosclerotic heart disease of native coronary artery without angina pectoris: Secondary | ICD-10-CM | POA: Diagnosis not present

## 2020-02-15 DIAGNOSIS — E1165 Type 2 diabetes mellitus with hyperglycemia: Secondary | ICD-10-CM | POA: Diagnosis not present

## 2020-02-15 DIAGNOSIS — I10 Essential (primary) hypertension: Secondary | ICD-10-CM | POA: Diagnosis not present

## 2020-02-15 NOTE — Patient Instructions (Signed)
-  Try phillips colon health or align - these are probiotics over the counter.  -If having symptoms in 2 weeks please call and will consider repeat course of metronidazole.

## 2020-02-15 NOTE — Progress Notes (Signed)
Patient profile: Joseph Burton is a 74 y.o. male seen for follow-up. Past medical history coronary artery disease, hypertension, sleep apnea, diabetes.  History of Present Illness: Joseph Burton is seen today for follow up. Last seen 03/2018.  He reports symptoms completely resolved in 03/2018 following his endoscopy. he continued to feel well until about 4 weeks ago, reports about 4 weeks ago bloating and gas returned.  Feels that often gas feels trapped and he is unable to belch.  He does endorse some early satiety with the feelings of bloating and fullness in his abdomen.  He denies nausea vomiting.  Generally Protonix controls GERD well but does occasionally supplement with Tums. Tums does help the symptoms some. He does feel the bloating improves if he is active.  Generally bowel habits are regular. From twice a day to every other day.  He denies rectal bleeding or melena.  No lower abdominal pain  He is currently on amoxicillin 7-day course 3 times daily for spider bite on his leg.    We discussed his weight loss-he endorses this is mainly due to better controlling his diabetes and decreasing breads and starches.  Denies postprandial abdominal pain etc. limiting appetite and p.o. intake.  Wt Readings from Last 3 Encounters:  02/15/20 191 lb 11.2 oz (87 kg)  03/13/19 206 lb (93.4 kg)  03/16/18 203 lb 8 oz (92.3 kg)     Last Colonoscopy: 09/2015-Two 3 mm polyps in the cecum. Biopsied. - Diverticulosis in the sigmoid colon. - External and internal hemorrhoids 5 year repeat    Last Endoscopy: 03/2018-- Normal esophagus. - Z-line irregular, 42 cm from the incisors. - Normal stomach. - Normal duodenal bulb and second portion of the duodenum. - No specimens collected.    Past Medical History:  Past Medical History:  Diagnosis Date  . Arthritis   . Bloating 03/16/2018  . Coronary artery disease    Multivessel status post CABG 2002  . Early cataracts, bilateral     . Environmental and seasonal allergies   . Fatty liver   . GERD (gastroesophageal reflux disease)   . Hearing loss   . Hypertension   . Sleep apnea   . Type 2 diabetes mellitus ([place])   . Umbilical hernia     Problem List: Patient Active Problem List   Diagnosis Date Noted  . Gastroesophageal reflux disease without esophagitis 03/17/2018  . Bloating 03/16/2018    Past Surgical History: Past Surgical History:  Procedure Laterality Date  . CARDIAC CATHETERIZATION     2002  . CHOLECYSTECTOMY     3 years ago  . COLONOSCOPY N/A 10/02/2015   Procedure: COLONOSCOPY;  Surgeon: Rogene Houston, MD;  [facility];  Service: Endoscopy;  Laterality: N/A;  930  . CORONARY ARTERY BYPASS GRAFT  2002   2002  . ESOPHAGOGASTRODUODENOSCOPY N/A 03/28/2018   Procedure: ESOPHAGOGASTRODUODENOSCOPY (EGD);  Surgeon: Rogene Houston, MD;  [facility];  Service: Endoscopy;  Laterality: N/A;  2:20  . INSERTION OF MESH N/A 05/29/2016   Procedure: INSERTION OF MESH;  Surgeon: Ralene Ok, MD;  Location: [place];  Service: General;  Laterality: N/A;  . JOINT REPLACEMENT     Left shoulder  . SHOULDER ARTHROSCOPY W/ ROTATOR CUFF REPAIR Left 07/2010  . TONSILLECTOMY  1969  . TREATMENT FISTULA ANAL    . UMBILICAL HERNIA REPAIR N/A 05/29/2016   Procedure: LAPAROSCOPIC UMBILICAL HERNIA;  Surgeon: Ralene Ok, MD;  Location: [facility];  Service: General;  Laterality: N/A;    Allergies: Allergies  Allergen Reactions  . No Known Allergies       Home Medications:  Current Outpatient Medications:  .  amoxicillin (AMOXIL) 500 MG capsule, Take 500 mg by mouth 3 (three) times daily., Disp: , Rfl:  .  atenolol (TENORMIN) 50 MG tablet, Take 25 mg by mouth at bedtime., Disp: , Rfl: 12 .  atorvastatin (LIPITOR) 20 MG tablet, Take 1 tablet (20 mg total) by mouth daily. (Patient taking differently: Take 10 mg by mouth daily. ), Disp: 90 tablet, Rfl: 3 .  Calcium Carb-Cholecalciferol (CALCIUM  600+D3 PO), Take 1 tablet by mouth at bedtime., Disp: , Rfl:  .  carboxymethylcellulose (REFRESH PLUS) 0.5 % SOLN, Place 1 drop into both eyes 3 (three) times daily as needed (for dry/irritated/watery eyes.)., Disp: , Rfl:  .  Chromium 1000 MCG TABS, Take 1,000 mcg by mouth., Disp: , Rfl:  .  Copper Gluconate 2 MG CAPS, Take 2 mg by mouth daily., Disp: , Rfl:  .  cyanocobalamin 1000 MCG tablet, Take 500 mcg by mouth daily. , Disp: , Rfl:  .  folic acid (FOLVITE) 614 MCG tablet, Take 400 mcg by mouth daily., Disp: , Rfl:  .  ipratropium (ATROVENT) 0.06 % nasal spray, Place 2 sprays into both nostrils 3 (three) times daily as needed (NASAL CONGESTION/ALLERGIES.). , Disp: , Rfl:  .  metFORMIN (GLUCOPHAGE-XR) 500 MG 24 hr tablet, Take 500 mg by mouth at bedtime. , Disp: , Rfl:  .  nabumetone (RELAFEN) 750 MG tablet, Take 750 mg by mouth 2 (two) times daily. , Disp: , Rfl:  .  pantoprazole (PROTONIX) 40 MG tablet, TAKE 1 TABLET BY MOUTH DAILY. needs office visit FOR refills, Disp: 30 tablet, Rfl: 1 .  pyridOXINE (VITAMIN B-6) 100 MG tablet, Take 100 mg by mouth daily., Disp: , Rfl:  .  Red Yeast Rice Extract 600 MG CAPS, Take 600 mg by mouth 2 (two) times daily. , Disp: , Rfl:  .  Selenium (SELENIMIN-200 PO), Take 200 mg by mouth daily., Disp: , Rfl:  .  tamsulosin (FLOMAX) 0.4 MG CAPS capsule, Take 0.4 mg by mouth 2 (two) times daily. , Disp: , Rfl: 4 .  Zinc 50 MG CAPS, Take 50 mg by mouth daily., Disp: , Rfl:    Family History: family history includes Alzheimer's disease in his mother; Colon cancer in his father; Heart disease in his brother, father, and sister; Hypertension in his father; Melanoma in his father; Prostate cancer in his father.    Social History:   reports that he has never smoked. He has never used smokeless tobacco. He reports that he does not drink alcohol and does not use drugs.   Review of Systems: Constitutional: Denies weight loss/weight gain  Eyes: No changes in  vision. ENT: No oral lesions, sore throat.  GI: see HPI.  Heme/Lymph: No easy bruising.  CV: No chest pain.  GU: No hematuria.  Integumentary: No rashes.  Neuro: No headaches.  Psych: No depression/anxiety.  Endocrine: No heat/cold intolerance.  Allergic/Immunologic: No urticaria.  Resp: No cough, SOB.  Musculoskeletal: No joint swelling.    Physical Examination: BP 135/75 (BP Location: Right Arm, Patient Position: Sitting, Cuff Size: Normal)   Pulse 74   Temp (!) 96.9 F (36.1 C) (Temporal)   Ht 5' 9.5" (1.765 m)   Wt 191 lb 11.2 oz (87 kg)   BMI 27.90 kg/m  Gen: NAD, alert and oriented x 4 HEENT: PEERLA, EOMI,  Neck: supple, no JVD Chest: CTA bilaterally, no wheezes, crackles, or other adventitious sounds CV: RRR, no m/g/c/r Abd: soft, NT, ND, +BS in all four quadrants; no HSM, guarding, ridigity, or rebound tenderness Ext: no edema, well perfused with 2+ pulses, Skin: no rash or lesions noted on observed skin Lymph: no noted LAD  Data Reviewed:  12/2017-GES normal -  47.1% emptied at 1 hr ( normal >= 10%) 80.9% emptied at 2 hr ( normal >= 40%) 97.0% emptied at 3 hr ( normal >= 70%)    Assessment/Plan: Joseph Burton is a 74 y.o. male seen for yearly follow-up.  1.  GERD-well-controlled on Protonix 40 mg once a day.  Refill PPI.  Diet modifications discussed  2.  Bloating he feels this drastically improved after course of metronidazole 250mg  TID x 10d following his endoscopy 2019.  We discussed repeating a course of metronidazole but he is currently on amoxicillin for a spider bite.  Recommend he try probiotic Hardin Negus' colon health or align and if still symptomatic in 2 weeks would consider repeat metronidazole course at that time.  Dr. Laural Golden to provide further recommendations. Symptomatically describes symptoms somewhat suggestive of  gastroparesis but GES 2019 was normal and current symptoms are similar to 2019 symptoms    No lower GI symptoms. Due for  colonoscopy May 2022.     Josue was seen today for gastroesophageal reflux.  Diagnoses and all orders for this visit:  Gastroesophageal reflux disease, unspecified whether esophagitis present  Bloating   60min total pt care >50% face to face  Asked patient to call with a progress report in 2 weeks.  I personally performed the service, non-incident to. (WP)  Laurine Blazer, [facility]

## 2020-02-26 DIAGNOSIS — E1159 Type 2 diabetes mellitus with other circulatory complications: Secondary | ICD-10-CM | POA: Diagnosis not present

## 2020-02-26 DIAGNOSIS — R059 Cough, unspecified: Secondary | ICD-10-CM | POA: Diagnosis not present

## 2020-02-26 DIAGNOSIS — Z299 Encounter for prophylactic measures, unspecified: Secondary | ICD-10-CM | POA: Diagnosis not present

## 2020-02-26 DIAGNOSIS — J069 Acute upper respiratory infection, unspecified: Secondary | ICD-10-CM | POA: Diagnosis not present

## 2020-02-29 DIAGNOSIS — J988 Other specified respiratory disorders: Secondary | ICD-10-CM | POA: Diagnosis not present

## 2020-03-07 DIAGNOSIS — Z20822 Contact with and (suspected) exposure to covid-19: Secondary | ICD-10-CM | POA: Diagnosis not present

## 2020-03-07 DIAGNOSIS — R42 Dizziness and giddiness: Secondary | ICD-10-CM | POA: Diagnosis not present

## 2020-03-07 DIAGNOSIS — R059 Cough, unspecified: Secondary | ICD-10-CM | POA: Diagnosis not present

## 2020-03-15 DIAGNOSIS — I2510 Atherosclerotic heart disease of native coronary artery without angina pectoris: Secondary | ICD-10-CM | POA: Diagnosis not present

## 2020-03-15 DIAGNOSIS — E7800 Pure hypercholesterolemia, unspecified: Secondary | ICD-10-CM | POA: Diagnosis not present

## 2020-03-15 DIAGNOSIS — I10 Essential (primary) hypertension: Secondary | ICD-10-CM | POA: Diagnosis not present

## 2020-03-16 DIAGNOSIS — E1165 Type 2 diabetes mellitus with hyperglycemia: Secondary | ICD-10-CM | POA: Diagnosis not present

## 2020-03-26 DIAGNOSIS — E1143 Type 2 diabetes mellitus with diabetic autonomic (poly)neuropathy: Secondary | ICD-10-CM | POA: Diagnosis not present

## 2020-03-26 DIAGNOSIS — M79672 Pain in left foot: Secondary | ICD-10-CM | POA: Diagnosis not present

## 2020-03-26 DIAGNOSIS — M79671 Pain in right foot: Secondary | ICD-10-CM | POA: Diagnosis not present

## 2020-04-01 ENCOUNTER — Other Ambulatory Visit (INDEPENDENT_AMBULATORY_CARE_PROVIDER_SITE_OTHER): Payer: Self-pay | Admitting: Gastroenterology

## 2020-04-01 DIAGNOSIS — K219 Gastro-esophageal reflux disease without esophagitis: Secondary | ICD-10-CM

## 2020-04-08 DIAGNOSIS — Z299 Encounter for prophylactic measures, unspecified: Secondary | ICD-10-CM | POA: Diagnosis not present

## 2020-04-08 DIAGNOSIS — I10 Essential (primary) hypertension: Secondary | ICD-10-CM | POA: Diagnosis not present

## 2020-04-08 DIAGNOSIS — E1165 Type 2 diabetes mellitus with hyperglycemia: Secondary | ICD-10-CM | POA: Diagnosis not present

## 2020-04-08 DIAGNOSIS — Z6829 Body mass index (BMI) 29.0-29.9, adult: Secondary | ICD-10-CM | POA: Diagnosis not present

## 2020-04-08 DIAGNOSIS — Z2821 Immunization not carried out because of patient refusal: Secondary | ICD-10-CM | POA: Diagnosis not present

## 2020-04-08 DIAGNOSIS — I2510 Atherosclerotic heart disease of native coronary artery without angina pectoris: Secondary | ICD-10-CM | POA: Diagnosis not present

## 2020-04-16 DIAGNOSIS — I10 Essential (primary) hypertension: Secondary | ICD-10-CM | POA: Diagnosis not present

## 2020-04-16 DIAGNOSIS — I2510 Atherosclerotic heart disease of native coronary artery without angina pectoris: Secondary | ICD-10-CM | POA: Diagnosis not present

## 2020-04-16 DIAGNOSIS — E1165 Type 2 diabetes mellitus with hyperglycemia: Secondary | ICD-10-CM | POA: Diagnosis not present

## 2020-04-16 DIAGNOSIS — E7800 Pure hypercholesterolemia, unspecified: Secondary | ICD-10-CM | POA: Diagnosis not present

## 2020-04-26 DIAGNOSIS — Z6829 Body mass index (BMI) 29.0-29.9, adult: Secondary | ICD-10-CM | POA: Diagnosis not present

## 2020-04-26 DIAGNOSIS — Z0000 Encounter for general adult medical examination without abnormal findings: Secondary | ICD-10-CM | POA: Diagnosis not present

## 2020-04-26 DIAGNOSIS — E7800 Pure hypercholesterolemia, unspecified: Secondary | ICD-10-CM | POA: Diagnosis not present

## 2020-04-26 DIAGNOSIS — Z299 Encounter for prophylactic measures, unspecified: Secondary | ICD-10-CM | POA: Diagnosis not present

## 2020-04-26 DIAGNOSIS — Z79899 Other long term (current) drug therapy: Secondary | ICD-10-CM | POA: Diagnosis not present

## 2020-04-26 DIAGNOSIS — I10 Essential (primary) hypertension: Secondary | ICD-10-CM | POA: Diagnosis not present

## 2020-04-26 DIAGNOSIS — E1165 Type 2 diabetes mellitus with hyperglycemia: Secondary | ICD-10-CM | POA: Diagnosis not present

## 2020-04-26 DIAGNOSIS — Z7189 Other specified counseling: Secondary | ICD-10-CM | POA: Diagnosis not present

## 2020-04-26 DIAGNOSIS — Z125 Encounter for screening for malignant neoplasm of prostate: Secondary | ICD-10-CM | POA: Diagnosis not present

## 2020-04-26 DIAGNOSIS — R5383 Other fatigue: Secondary | ICD-10-CM | POA: Diagnosis not present

## 2020-04-26 DIAGNOSIS — Z789 Other specified health status: Secondary | ICD-10-CM | POA: Diagnosis not present

## 2020-04-26 DIAGNOSIS — Z1331 Encounter for screening for depression: Secondary | ICD-10-CM | POA: Diagnosis not present

## 2020-04-26 DIAGNOSIS — E1159 Type 2 diabetes mellitus with other circulatory complications: Secondary | ICD-10-CM | POA: Diagnosis not present

## 2020-04-26 DIAGNOSIS — Z1339 Encounter for screening examination for other mental health and behavioral disorders: Secondary | ICD-10-CM | POA: Diagnosis not present

## 2020-05-16 DIAGNOSIS — E1165 Type 2 diabetes mellitus with hyperglycemia: Secondary | ICD-10-CM | POA: Diagnosis not present

## 2020-05-23 DIAGNOSIS — I10 Essential (primary) hypertension: Secondary | ICD-10-CM | POA: Diagnosis not present

## 2020-05-23 DIAGNOSIS — Z789 Other specified health status: Secondary | ICD-10-CM | POA: Diagnosis not present

## 2020-05-23 DIAGNOSIS — E1165 Type 2 diabetes mellitus with hyperglycemia: Secondary | ICD-10-CM | POA: Diagnosis not present

## 2020-05-23 DIAGNOSIS — Z299 Encounter for prophylactic measures, unspecified: Secondary | ICD-10-CM | POA: Diagnosis not present

## 2020-05-23 DIAGNOSIS — I2510 Atherosclerotic heart disease of native coronary artery without angina pectoris: Secondary | ICD-10-CM | POA: Diagnosis not present

## 2020-05-28 ENCOUNTER — Other Ambulatory Visit: Payer: Self-pay | Admitting: Cardiology

## 2020-05-28 ENCOUNTER — Other Ambulatory Visit (INDEPENDENT_AMBULATORY_CARE_PROVIDER_SITE_OTHER): Payer: Self-pay | Admitting: Internal Medicine

## 2020-05-28 DIAGNOSIS — K219 Gastro-esophageal reflux disease without esophagitis: Secondary | ICD-10-CM

## 2020-05-31 ENCOUNTER — Ambulatory Visit: Payer: Medicare Other | Admitting: Cardiology

## 2020-06-07 DIAGNOSIS — Z299 Encounter for prophylactic measures, unspecified: Secondary | ICD-10-CM | POA: Diagnosis not present

## 2020-06-07 DIAGNOSIS — E1159 Type 2 diabetes mellitus with other circulatory complications: Secondary | ICD-10-CM | POA: Diagnosis not present

## 2020-06-07 DIAGNOSIS — I959 Hypotension, unspecified: Secondary | ICD-10-CM | POA: Diagnosis not present

## 2020-06-07 DIAGNOSIS — U071 COVID-19: Secondary | ICD-10-CM | POA: Diagnosis not present

## 2020-06-07 DIAGNOSIS — Z789 Other specified health status: Secondary | ICD-10-CM | POA: Diagnosis not present

## 2020-06-17 DIAGNOSIS — E1165 Type 2 diabetes mellitus with hyperglycemia: Secondary | ICD-10-CM | POA: Diagnosis not present

## 2020-06-18 ENCOUNTER — Ambulatory Visit: Payer: Medicare Other | Admitting: Physician Assistant

## 2020-07-07 NOTE — Progress Notes (Addendum)
Cardiology Office Note:    Date:  07/09/2020   ID:  Joseph Burton, DOB 11-09-1945, MRN [id number]  PCP:  Glenda Chroman, MD  Cardiologist:  Rozann Lesches, MD   Referring MD: Glenda Chroman, MD   Chief Complaint  Patient presents with  . Follow-up  CAD  History of Present Illness:    Joseph Burton is a 75 y.o. male with a hx of DM2, GERD, CAD s/p CABG in 2002, HTN, and HFpEF. Last stress test was 2017 and was nonischemic. Echo in 2017 with normal EF, grade 2 DD, and mild TR. He was last seen by Dr. Domenic Polite in 2020 and was doing well at that time.   He presents today for 2 year follow up. He can complete more than 4.0 METS without angina - they have stairs in the house, moderate house work, yard work.   Last A1c was 6.8%.  He has reduced sugar from his diet.   He has bloating that he is working with GI. He gets SOB when he has increased abdominal bloating, suspect due to food sensitivity.    He is not taking ASA - states Dr. Domenic Polite took him off of it, but doesn't remember why. He did not have a bleeding event.   Past Medical History:  Diagnosis Date  . Arthritis   . Bloating 03/16/2018  . Coronary artery disease    Multivessel status post CABG 2002  . Early cataracts, bilateral   . Environmental and seasonal allergies   . Fatty liver   . GERD (gastroesophageal reflux disease)   . Hearing loss   . Hypertension   . Sleep apnea   . Type 2 diabetes mellitus ([place])   . Umbilical hernia     Past Surgical History:  Procedure Laterality Date  . CARDIAC CATHETERIZATION     2002  . CHOLECYSTECTOMY     3 years ago  . COLONOSCOPY N/A 10/02/2015   Procedure: COLONOSCOPY;  Surgeon: Rogene Houston, MD;  [facility];  Service: Endoscopy;  Laterality: N/A;  930  . CORONARY ARTERY BYPASS GRAFT  2002   2002  . ESOPHAGOGASTRODUODENOSCOPY N/A 03/28/2018   Procedure: ESOPHAGOGASTRODUODENOSCOPY (EGD);  Surgeon: Rogene Houston, MD;  [facility];   Service: Endoscopy;  Laterality: N/A;  2:20  . INSERTION OF MESH N/A 05/29/2016   Procedure: INSERTION OF MESH;  Surgeon: Ralene Ok, MD;  Location: [place];  Service: General;  Laterality: N/A;  . JOINT REPLACEMENT     Left shoulder  . SHOULDER ARTHROSCOPY W/ ROTATOR CUFF REPAIR Left 07/2010  . TONSILLECTOMY  1969  . TREATMENT FISTULA ANAL    . UMBILICAL HERNIA REPAIR N/A 05/29/2016   Procedure: LAPAROSCOPIC UMBILICAL HERNIA;  Surgeon: Ralene Ok, MD;  Location: [place];  Service: General;  Laterality: N/A;    Current Medications: Current Meds  Medication Sig  . atenolol (TENORMIN) 50 MG tablet Take 25 mg by mouth at bedtime.  Marland Kitchen atorvastatin (LIPITOR) 20 MG tablet TAKE 1 TABLET BY MOUTH DAILY  . Calcium Carb-Cholecalciferol (CALCIUM 600+D3 PO) Take 1 tablet by mouth at bedtime.  . carboxymethylcellulose (REFRESH PLUS) 0.5 % SOLN Place 1 drop into both eyes 3 (three) times daily as needed (for dry/irritated/watery eyes.).  Marland Kitchen Chromium 1000 MCG TABS Take 1,000 mcg by mouth.  . Copper Gluconate 2 MG CAPS Take 2 mg by mouth daily.  . cyanocobalamin 1000 MCG tablet Take 500 mcg by mouth daily.   . folic acid (  FOLVITE) 400 MCG tablet Take 400 mcg by mouth daily.  Marland Kitchen ipratropium (ATROVENT) 0.06 % nasal spray Place 2 sprays into both nostrils 3 (three) times daily as needed (NASAL CONGESTION/ALLERGIES.).   Marland Kitchen metFORMIN (GLUCOPHAGE-XR) 500 MG 24 hr tablet Take 500 mg by mouth at bedtime.   . nabumetone (RELAFEN) 750 MG tablet Take 750 mg by mouth 2 (two) times daily.   . pantoprazole (PROTONIX) 40 MG tablet TAKE 1 TABLET BY MOUTH DAILY. needs office visit FOR refills  . pyridOXINE (VITAMIN B-6) 100 MG tablet Take 100 mg by mouth daily.  . Red Yeast Rice Extract 600 MG CAPS Take 600 mg by mouth 2 (two) times daily.   . Selenium (SELENIMIN-200 PO) Take 200 mg by mouth daily.  . tamsulosin (FLOMAX) 0.4 MG CAPS capsule Take 0.4 mg by mouth 2 (two) times daily.   . Zinc 50 MG CAPS Take 50 mg by  mouth daily.     Allergies:   No known allergies   Social History   Socioeconomic History  . Marital status: Married    Spouse name: Not on file  . Number of children: Not on file  . Years of education: Not on file  . Highest education level: Not on file  Occupational History  . Not on file  Tobacco Use  . Smoking status: Never Smoker  . Smokeless tobacco: Never Used  Vaping Use  . Vaping Use: Never used  Substance and Sexual Activity  . Alcohol use: No  . Drug use: No  . Sexual activity: Not on file  Other Topics Concern  . Not on file  Social History Narrative  . Not on file   Social Determinants of Health   Financial Resource Strain: Not on file  Food Insecurity: Not on file  Transportation Needs: Not on file  Physical Activity: Not on file  Stress: Not on file  Social Connections: Not on file     Family History: The patient's family history includes Alzheimer's disease in his mother; Colon cancer in his father; Heart disease in his brother, father, and sister; Hypertension in his father; Melanoma in his father; Prostate cancer in his father.  ROS:   Please see the history of present illness.     All other systems reviewed and are negative.  EKGs/Labs/Other Studies Reviewed:    The following studies were reviewed today:  Echo 2017: Study Conclusions   - Left ventricle: The cavity size was normal. Wall thickness was  increased in a pattern of mild LVH. Systolic function was  vigorous. The estimated ejection fraction was in the range of 65%  to 70%. Wall motion was normal; there were no regional wall  motion abnormalities. Features are consistent with a pseudonormal  left ventricular filling pattern, with concomitant abnormal  relaxation and increased filling pressure (grade 2 diastolic  dysfunction).  - Mitral valve: There was trivial regurgitation.  - Right atrium: Central venous pressure (est): 3 mm Hg.  - Tricuspid valve: There was  mild regurgitation.  - Pulmonary arteries: PA peak pressure: 33 mm Hg (S).  - Pericardium, extracardiac: There was no pericardial effusion.   Impressions:   - Mild LVH with LVEF 65-70%. Grade 2 diastolic dysfunction. Trivial  mitral regurgitation. Mild tricuspid regurgitation with PASP 33  mmHg.   EKG:  EKG is ordered today.  The ekg ordered today demonstrates sinus rhythm HR 73 with nonspecific ST changes consistent with prior tracings  Recent Labs: No results found for requested labs within last  8760 hours.  Recent Lipid Panel No results found for: CHOL, TRIG, HDL, CHOLHDL, VLDL, LDLCALC, LDLDIRECT  Physical Exam:    VS:  BP 140/86   Pulse 74   Ht 5' 9.5" (1.765 m)   Wt 199 lb (90.3 kg)   SpO2 97%   BMI 28.97 kg/m     Wt Readings from Last 3 Encounters:  07/09/20 199 lb (90.3 kg)  02/15/20 191 lb 11.2 oz (87 kg)  03/13/19 206 lb (93.4 kg)     GEN:  Well nourished, well developed in no acute distress HEENT: Normal NECK: No JVD; No carotid bruits LYMPHATICS: No lymphadenopathy CARDIAC: RRR, no murmurs, rubs, gallops RESPIRATORY:  Clear to auscultation without rales, wheezing or rhonchi  ABDOMEN: Soft, non-tender, non-distended MUSCULOSKELETAL:  No edema; No deformity  SKIN: Warm and dry NEUROLOGIC:  Alert and oriented x 3 PSYCHIATRIC:  Normal affect   ASSESSMENT:    1. Coronary artery disease involving native coronary artery of native heart with angina pectoris ([place])   2. Mixed hyperlipidemia   3. Type 2 diabetes mellitus without complication, without long-term current use of insulin ([place])   4. OSA on CPAP   5. Gastroesophageal reflux disease without esophagitis   6. Primary hypertension   7. Chronic heart failure with preserved ejection fraction ([facility])    PLAN:    In order of problems listed above:  CAD s/p CABG in 2002 - he is active and can complete well over 4.0 METS - reassuring NST in 2017 - continue BB and statin - he declines PRN nitro  prescription today - pt states he was told to stop ASA - confirmed with Dr. Domenic Polite he needs to restart 81 mg ASA   Hypertension - generally in the 120-130s at home - ate a salty meal last night ([place]) and rushed to the appt - hold off on making medication changes - he will call if he is consistently in the 140s at home   Hyperlipidemia with LDL goal < 70 - on lipitor  - followed by PCP   Chronic diastolic heart failure - Grade 2 DD - appears euvolemic today - does not require a diuretic   OSA on CPAP - continue   DM - followed by PCP - A1c 6.8% - he is on metformin - if additional agent is needed, could consider SGLT2i  Follow up in 1 year.    Medication Adjustments/Labs and Tests Ordered: Current medicines are reviewed at length with the patient today.  Concerns regarding medicines are outlined above.  Orders Placed This Encounter  Procedures  . EKG 12-Lead   No orders of the defined types were placed in this encounter.   Signed, Ledora Bottcher, [place]  07/09/2020 10:54 AM    [facility]

## 2020-07-09 ENCOUNTER — Ambulatory Visit (INDEPENDENT_AMBULATORY_CARE_PROVIDER_SITE_OTHER): Payer: Medicare Other | Admitting: Physician Assistant

## 2020-07-09 ENCOUNTER — Other Ambulatory Visit: Payer: Self-pay

## 2020-07-09 ENCOUNTER — Encounter: Payer: Self-pay | Admitting: Physician Assistant

## 2020-07-09 VITALS — BP 140/86 | HR 74 | Ht 69.5 in | Wt 199.0 lb

## 2020-07-09 DIAGNOSIS — I25119 Atherosclerotic heart disease of native coronary artery with unspecified angina pectoris: Secondary | ICD-10-CM | POA: Diagnosis not present

## 2020-07-09 DIAGNOSIS — G4733 Obstructive sleep apnea (adult) (pediatric): Secondary | ICD-10-CM | POA: Diagnosis not present

## 2020-07-09 DIAGNOSIS — K219 Gastro-esophageal reflux disease without esophagitis: Secondary | ICD-10-CM | POA: Diagnosis not present

## 2020-07-09 DIAGNOSIS — E1159 Type 2 diabetes mellitus with other circulatory complications: Secondary | ICD-10-CM | POA: Insufficient documentation

## 2020-07-09 DIAGNOSIS — E119 Type 2 diabetes mellitus without complications: Secondary | ICD-10-CM

## 2020-07-09 DIAGNOSIS — Z9989 Dependence on other enabling machines and devices: Secondary | ICD-10-CM

## 2020-07-09 DIAGNOSIS — E782 Mixed hyperlipidemia: Secondary | ICD-10-CM | POA: Diagnosis not present

## 2020-07-09 DIAGNOSIS — I10 Essential (primary) hypertension: Secondary | ICD-10-CM | POA: Diagnosis not present

## 2020-07-09 DIAGNOSIS — I5032 Chronic diastolic (congestive) heart failure: Secondary | ICD-10-CM

## 2020-07-09 MED ORDER — ASPIRIN 81 MG PO TBEC: 81.0000 mg | DELAYED_RELEASE_TABLET | Freq: Every day | ORAL | 12 refills | Status: DC

## 2020-07-09 NOTE — Addendum Note (Signed)
Addended by: Christella Scheuermann C on: 07/09/2020 12:08 PM   Modules accepted: Orders

## 2020-07-09 NOTE — Patient Instructions (Signed)
Medication Instructions:  Your physician recommends that you continue on your current medications as directed. Please refer to the Current Medication list given to you today.  *If you need a refill on your cardiac medications before your next appointment, please call your pharmacy*   Lab Work: None  If you have labs (blood work) drawn today and your tests are completely normal, you will receive your results only by: Marland Kitchen MyChart Message (if you have MyChart) OR . A paper copy in the mail If you have any lab test that is abnormal or we need to change your treatment, we will call you to review the results.   Testing/Procedures: None   Follow-Up: At [facility], you and your health needs are our priority.  As part of our continuing mission to provide you with exceptional heart care, we have created designated Provider Care Teams.  These Care Teams include your primary Cardiologist (physician) and Advanced Practice Providers (APPs -  Physician Assistants and Nurse Practitioners) who all work together to provide you with the care you need, when you need it.  We recommend signing up for the patient portal called "MyChart".  Sign up information is provided on this After Visit Summary.  MyChart is used to connect with patients for Virtual Visits (Telemedicine).  Patients are able to view lab/test results, encounter notes, upcoming appointments, etc.  Non-urgent messages can be sent to your provider as well.   To learn more about what you can do with MyChart, go to [email or web address].    Your next appointment:   12 month(s)  The format for your next appointment:   In Person  Provider:   You may see Rozann Lesches, MD or one of the following Advanced Practice Providers on your designated Care Team:    Bernerd Pho, PA-C   Ermalinda Barrios, PA-C     Other Instructions If extra units are needed for diabetes, may consider an SGL2 inhibitor such as [place] or [place].

## 2020-07-15 DIAGNOSIS — E1165 Type 2 diabetes mellitus with hyperglycemia: Secondary | ICD-10-CM | POA: Diagnosis not present

## 2020-08-15 DIAGNOSIS — E1165 Type 2 diabetes mellitus with hyperglycemia: Secondary | ICD-10-CM | POA: Diagnosis not present

## 2020-09-03 DIAGNOSIS — Z299 Encounter for prophylactic measures, unspecified: Secondary | ICD-10-CM | POA: Diagnosis not present

## 2020-09-03 DIAGNOSIS — I10 Essential (primary) hypertension: Secondary | ICD-10-CM | POA: Diagnosis not present

## 2020-09-03 DIAGNOSIS — E1165 Type 2 diabetes mellitus with hyperglycemia: Secondary | ICD-10-CM | POA: Diagnosis not present

## 2020-09-11 ENCOUNTER — Encounter (INDEPENDENT_AMBULATORY_CARE_PROVIDER_SITE_OTHER): Payer: Self-pay | Admitting: *Deleted

## 2020-09-13 DIAGNOSIS — E1165 Type 2 diabetes mellitus with hyperglycemia: Secondary | ICD-10-CM | POA: Diagnosis not present

## 2020-09-24 ENCOUNTER — Other Ambulatory Visit (INDEPENDENT_AMBULATORY_CARE_PROVIDER_SITE_OTHER): Payer: Self-pay | Admitting: *Deleted

## 2020-09-25 ENCOUNTER — Encounter (INDEPENDENT_AMBULATORY_CARE_PROVIDER_SITE_OTHER): Payer: Self-pay | Admitting: *Deleted

## 2020-09-25 ENCOUNTER — Telehealth (INDEPENDENT_AMBULATORY_CARE_PROVIDER_SITE_OTHER): Payer: Self-pay | Admitting: *Deleted

## 2020-09-25 ENCOUNTER — Other Ambulatory Visit (INDEPENDENT_AMBULATORY_CARE_PROVIDER_SITE_OTHER): Payer: Self-pay | Admitting: *Deleted

## 2020-09-25 MED ORDER — PEG 3350-KCL-NA BICARB-NACL 420 G PO SOLR: 4000.0000 mL | Freq: Once | ORAL | 0 refills | Status: AC

## 2020-09-25 NOTE — Telephone Encounter (Signed)
Done

## 2020-09-25 NOTE — Telephone Encounter (Signed)
Patient needs trilyte 

## 2020-09-26 ENCOUNTER — Other Ambulatory Visit (INDEPENDENT_AMBULATORY_CARE_PROVIDER_SITE_OTHER): Payer: Self-pay

## 2020-09-26 DIAGNOSIS — Z800 Family history of malignant neoplasm of digestive organs: Secondary | ICD-10-CM

## 2020-09-26 DIAGNOSIS — Z86010 Personal history of colonic polyps: Secondary | ICD-10-CM

## 2020-10-15 DIAGNOSIS — E1165 Type 2 diabetes mellitus with hyperglycemia: Secondary | ICD-10-CM | POA: Diagnosis not present

## 2020-10-18 ENCOUNTER — Telehealth (INDEPENDENT_AMBULATORY_CARE_PROVIDER_SITE_OTHER): Payer: Self-pay | Admitting: *Deleted

## 2020-10-18 NOTE — Telephone Encounter (Signed)
Referring MD/PCP: vyas  Procedure: tcs  Reason/Indication:  Hx polyps, fam hx colon ca  Has patient had this procedure before?  Yes, 09/2015  If so, when, by whom and where?    Is there a family history of colon cancer?  Yes, father  Who?  What age when diagnosed?    Is patient diabetic? If yes, Type 1 or Type 2   Yes, type 2      Does patient have prosthetic heart valve or mechanical valve?  no  Do you have a pacemaker/defibrillator?  no  Has patient ever had endocarditis/atrial fibrillation? no  Have you had a stroke/heart attack last 6 mths? no  Does patient use oxygen? no  Has patient had joint replacement within last 12 months?  no  Is patient constipated or do they take laxatives? no  Does patient have a history of alcohol/drug use?  no  Is patient on blood thinner such as Coumadin, Plavix and/or Aspirin? yes  Do you take medicine for weight loss?  no  For male patients,: do you still have your menstrual cycle? n/a  Medications: asa 81 mg daily, tamsulosin 0.4 mg bid, atenolol 25 mg daily, ipratopium 0.06% prn, ivabumetone 750 mg daily, pantoprazole 40 mg daily, metformin 500 mg daily, atorvastatin 20 mg daily  Allergies: nkda  Medication Adjustment per Dr Rehman/Dr Jenetta Downer asa 2 days, hold metformin evening before & morning of   Procedure date & time: 11/14/20

## 2020-10-21 ENCOUNTER — Telehealth (INDEPENDENT_AMBULATORY_CARE_PROVIDER_SITE_OTHER): Payer: Self-pay

## 2020-10-21 ENCOUNTER — Encounter (INDEPENDENT_AMBULATORY_CARE_PROVIDER_SITE_OTHER): Payer: Self-pay

## 2020-10-21 MED ORDER — PEG 3350-KCL-NA BICARB-NACL 420 G PO SOLR: 4000.0000 mL | ORAL | 0 refills | Status: DC

## 2020-10-21 NOTE — Telephone Encounter (Signed)
Joseph Burton, CMA  

## 2020-10-24 ENCOUNTER — Other Ambulatory Visit: Payer: Self-pay | Admitting: Cardiology

## 2020-11-11 ENCOUNTER — Other Ambulatory Visit (HOSPITAL_COMMUNITY): Payer: Medicare Other

## 2020-11-12 ENCOUNTER — Other Ambulatory Visit (INDEPENDENT_AMBULATORY_CARE_PROVIDER_SITE_OTHER): Payer: Self-pay | Admitting: Internal Medicine

## 2020-11-12 DIAGNOSIS — K219 Gastro-esophageal reflux disease without esophagitis: Secondary | ICD-10-CM

## 2020-11-13 ENCOUNTER — Ambulatory Visit (HOSPITAL_COMMUNITY): Admit: 2020-11-13 | Payer: Medicare Other | Admitting: Internal Medicine

## 2020-11-13 ENCOUNTER — Encounter (HOSPITAL_COMMUNITY): Payer: Self-pay

## 2020-11-13 ENCOUNTER — Other Ambulatory Visit (HOSPITAL_COMMUNITY): Payer: Medicare Other

## 2020-11-13 SURGERY — COLONOSCOPY WITH PROPOFOL
Anesthesia: Monitor Anesthesia Care

## 2020-11-14 ENCOUNTER — Ambulatory Visit (HOSPITAL_COMMUNITY)
Admission: RE | Admit: 2020-11-14 | Discharge: 2020-11-14 | Disposition: A | Discharge status: Home / Self Care | Payer: Medicare Other | Attending: Internal Medicine | Admitting: Internal Medicine

## 2020-11-14 ENCOUNTER — Other Ambulatory Visit: Payer: Self-pay

## 2020-11-14 ENCOUNTER — Encounter (HOSPITAL_COMMUNITY): Payer: Self-pay | Admitting: Internal Medicine

## 2020-11-14 ENCOUNTER — Encounter (HOSPITAL_COMMUNITY)
Admission: RE | Disposition: A | Discharge status: Home / Self Care | Payer: Self-pay | Source: Home / Self Care | Attending: Internal Medicine

## 2020-11-14 DIAGNOSIS — Z86010 Personal history of colonic polyps: Secondary | ICD-10-CM | POA: Diagnosis not present

## 2020-11-14 DIAGNOSIS — Z8249 Family history of ischemic heart disease and other diseases of the circulatory system: Secondary | ICD-10-CM | POA: Diagnosis not present

## 2020-11-14 DIAGNOSIS — Z1211 Encounter for screening for malignant neoplasm of colon: Secondary | ICD-10-CM | POA: Insufficient documentation

## 2020-11-14 DIAGNOSIS — Z800 Family history of malignant neoplasm of digestive organs: Secondary | ICD-10-CM | POA: Insufficient documentation

## 2020-11-14 DIAGNOSIS — D122 Benign neoplasm of ascending colon: Secondary | ICD-10-CM | POA: Insufficient documentation

## 2020-11-14 DIAGNOSIS — Z09 Encounter for follow-up examination after completed treatment for conditions other than malignant neoplasm: Secondary | ICD-10-CM | POA: Diagnosis not present

## 2020-11-14 DIAGNOSIS — Z791 Long term (current) use of non-steroidal anti-inflammatories (NSAID): Secondary | ICD-10-CM | POA: Diagnosis not present

## 2020-11-14 DIAGNOSIS — Z7984 Long term (current) use of oral hypoglycemic drugs: Secondary | ICD-10-CM | POA: Insufficient documentation

## 2020-11-14 DIAGNOSIS — D175 Benign lipomatous neoplasm of intra-abdominal organs: Secondary | ICD-10-CM | POA: Diagnosis not present

## 2020-11-14 DIAGNOSIS — Z7982 Long term (current) use of aspirin: Secondary | ICD-10-CM | POA: Diagnosis not present

## 2020-11-14 DIAGNOSIS — K5140 Inflammatory polyps of colon without complications: Secondary | ICD-10-CM | POA: Diagnosis not present

## 2020-11-14 DIAGNOSIS — K5730 Diverticulosis of large intestine without perforation or abscess without bleeding: Secondary | ICD-10-CM | POA: Insufficient documentation

## 2020-11-14 DIAGNOSIS — Z8042 Family history of malignant neoplasm of prostate: Secondary | ICD-10-CM | POA: Diagnosis not present

## 2020-11-14 DIAGNOSIS — E119 Type 2 diabetes mellitus without complications: Secondary | ICD-10-CM | POA: Diagnosis not present

## 2020-11-14 DIAGNOSIS — K6389 Other specified diseases of intestine: Secondary | ICD-10-CM | POA: Diagnosis not present

## 2020-11-14 DIAGNOSIS — E1165 Type 2 diabetes mellitus with hyperglycemia: Secondary | ICD-10-CM | POA: Diagnosis not present

## 2020-11-14 DIAGNOSIS — Z79899 Other long term (current) drug therapy: Secondary | ICD-10-CM | POA: Diagnosis not present

## 2020-11-14 HISTORY — PX: POLYPECTOMY: SHX5525

## 2020-11-14 HISTORY — PX: COLONOSCOPY: SHX5424

## 2020-11-14 LAB — GLUCOSE, CAPILLARY: Glucose-Capillary: 145 mg/dL — ABNORMAL HIGH (ref 70–99)

## 2020-11-14 SURGERY — COLONOSCOPY
Anesthesia: Moderate Sedation

## 2020-11-14 MED ORDER — MIDAZOLAM HCL 5 MG/5ML IJ SOLN
INTRAMUSCULAR | Status: AC
  Filled 2020-11-14: qty 10

## 2020-11-14 MED ORDER — MIDAZOLAM HCL 5 MG/5ML IJ SOLN
INTRAMUSCULAR | Status: DC | PRN
  Administered 2020-11-14: 2 mg via INTRAVENOUS
  Administered 2020-11-14 (×2): 1 mg via INTRAVENOUS
  Administered 2020-11-14: 2 mg via INTRAVENOUS

## 2020-11-14 MED ORDER — STERILE WATER FOR IRRIGATION IR SOLN
Status: DC | PRN
  Administered 2020-11-14: 135 mL

## 2020-11-14 MED ORDER — MEPERIDINE HCL 50 MG/ML IJ SOLN
INTRAMUSCULAR | Status: AC
  Filled 2020-11-14: qty 1

## 2020-11-14 MED ORDER — MEPERIDINE HCL 50 MG/ML IJ SOLN
INTRAMUSCULAR | Status: DC | PRN
  Administered 2020-11-14: 25 mg via INTRAVENOUS
  Administered 2020-11-14: 25 mg

## 2020-11-14 MED ORDER — SODIUM CHLORIDE 0.9 % IV SOLN: INTRAVENOUS | Status: DC

## 2020-11-14 NOTE — Op Note (Addendum)
[facility] Patient Name: Joseph Burton Procedure Date: 11/14/2020 10:38 AM MRN: [id number] Date of Birth: 1945-08-10 Attending MD: Hildred Laser , MD CSN: [id number] Age: 75 Admit Type: Outpatient Procedure:                Colonoscopy Indications:              High risk colon cancer surveillance: Personal                            history of colonic polyps; family history of colon                            cancer in first-degree relative at late onset. Providers:                Hildred Laser, MD, Caprice Kluver, Suzan Garibaldi. Risa Grill, Technician Referring MD:             Glenda Chroman, MD Medicines:                Meperidine 50 mg IV, Midazolam 6 mg IV Complications:            No immediate complications. Estimated Blood Loss:     Estimated blood loss was minimal. Procedure:                Pre-Anesthesia Assessment:                           - Prior to the procedure, a History and Physical                            was performed, and patient medications and                            allergies were reviewed. The patient's tolerance of                            previous anesthesia was also reviewed. The risks                            and benefits of the procedure and the sedation                            options and risks were discussed with the patient.                            All questions were answered, and informed consent                            was obtained. Prior Anticoagulants: The patient has                            taken no previous anticoagulant or antiplatelet  agents except for aspirin. ASA Grade Assessment:                            III - A patient with severe systemic disease. After                            reviewing the risks and benefits, the patient was                            deemed in satisfactory condition to undergo the                            procedure.                            After obtaining informed consent, the colonoscope                            was passed under direct vision. Throughout the                            procedure, the patient's blood pressure, pulse, and                            oxygen saturations were monitored continuously. The                            [id number]-[id number] ([id number]) scope was introduced through                            the anus and advanced to the the cecum, identified                            by appendiceal orifice and ileocecal valve. The                            colonoscopy was somewhat difficult. Successful                            completion of the procedure was aided by                            withdrawing the scope and replacing with the                            'babyscope'. The patient tolerated the procedure                            well. The quality of the bowel preparation was                            good. The ileocecal valve, appendiceal orifice, and  rectum were photographed. Scope In: 11:09:26 AM Scope Out: 11:48:33 AM Scope Withdrawal Time: 0 hours 29 minutes 46 seconds  Total Procedure Duration: 0 hours 39 minutes 7 seconds  Findings:      The perianal and digital rectal examinations were normal.      Two polyps were found in the ascending colon. The polyps were diminutive       in size. These were biopsied with a cold forceps for histology. The       pathology specimen was placed into Bottle Number 1.      There was a medium-sized cystic lesion in the proximal ascending colon.      A few diverticula were found in the sigmoid colon and hepatic flexure. Impression:               - Two diminutive polyps in the ascending colon.                            Biopsied.                           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                           - Diverticulosis in the sigmoid colon and at  the                            hepatic flexure. Moderate Sedation:      Moderate (conscious) sedation was administered by the endoscopy nurse       and supervised by the endoscopist. The following parameters were       monitored: oxygen saturation, heart rate, blood pressure, CO2       capnography and response to care. Total physician intraservice time was       43 minutes. Recommendation:           - Patient has a contact number available for                            emergencies. The signs and symptoms of potential                            delayed complications were discussed with the                            patient. Return to normal activities tomorrow.                            Written discharge instructions were provided to the                            patient.                           - High fiber diet and diabetic (ADA) diet today.                           -  Continue present medications.                           - No aspirin, ibuprofen, naproxen, or other                            non-steroidal anti-inflammatory drugs for 1 day.                           - Await pathology results.                           - Repeat colonoscopy is recommended. The                            colonoscopy date will be determined after pathology                            results from today's exam become available for                            review. Procedure Code(s):        --- Professional ---                           [phone], Colonoscopy, flexible; with biopsy, single                            or multiple                           99153, Moderate sedation; each additional 15                            minutes intraservice time                           99153, Moderate sedation; each additional 15                            minutes intraservice time                           G0500, Moderate sedation services provided by the                            same physician or other  qualified health care                            professional performing a gastrointestinal                            endoscopic service that sedation supports,                            requiring the presence of an independent trained  observer to assist in the monitoring of the                            patient's level of consciousness and physiological                            status; initial 15 minutes of intra-service time;                            patient age 54 years or older (additional time may                            be reported with [phone], as appropriate) Diagnosis Code(s):        --- Professional ---                           Z86.010, Personal history of colonic polyps                           K63.5, Polyp of colon                           D17.5, Benign lipomatous neoplasm of                            intra-abdominal organs                           K57.30, Diverticulosis of large intestine without                            perforation or abscess without bleeding CPT copyright 2019 [employer]. All rights reserved. The codes documented in this report are preliminary and upon coder review may  be revised to meet current compliance requirements. Hildred Laser, MD Hildred Laser, MD 11/14/2020 12:04:42 PM This report has been signed electronically. Number of Addenda: 0

## 2020-11-14 NOTE — H&P (Signed)
Joseph Burton is an 75 y.o. male.   Chief Complaint: Patient is here for colonoscopy. HPI: Patient is 75 year old Caucasian male who has history of colonic polyps and family history of colon carcinoma who is here for surveillance colonoscopy.  He denies abdominal pain change in bowel habits or rectal bleeding. Family history significant for colon carcinoma in father who was 44 at the time of diagnosis and lived to be 95. Patient is low-dose aspirin which is on hold.  Past Medical History:  Diagnosis Date   Arthritis    Bloating 03/16/2018   Coronary artery disease    Multivessel status post CABG 2002   Early cataracts, bilateral    Environmental and seasonal allergies    Fatty liver    GERD (gastroesophageal reflux disease)    Hearing loss    Hypertension    Sleep apnea    Type 2 diabetes mellitus ([place])    Umbilical hernia     Past Surgical History:  Procedure Laterality Date   CARDIAC CATHETERIZATION     2002   CHOLECYSTECTOMY     3 years ago   COLONOSCOPY N/A 10/02/2015   Procedure: COLONOSCOPY;  Surgeon: Rogene Houston, MD;  [facility]: [facility];  Service: Endoscopy;  Laterality: N/A;  [place] BYPASS GRAFT  2002   2002   ESOPHAGOGASTRODUODENOSCOPY N/A 03/28/2018   Procedure: ESOPHAGOGASTRODUODENOSCOPY (EGD);  Surgeon: Rogene Houston, MD;  Location: [facility];  Service: Endoscopy;  Laterality: N/A;  2:20   INSERTION OF MESH N/A 05/29/2016   Procedure: INSERTION OF MESH;  Surgeon: Ralene Ok, MD;  Location: [facility];  Service: General;  Laterality: N/A;   JOINT REPLACEMENT     Left shoulder   SHOULDER ARTHROSCOPY W/ ROTATOR CUFF REPAIR Left 07/2010   TONSILLECTOMY  1969   TREATMENT FISTULA ANAL     UMBILICAL HERNIA REPAIR N/A 05/29/2016   Procedure: LAPAROSCOPIC UMBILICAL HERNIA;  Surgeon: Ralene Ok, MD;  Location: [facility];  Service: General;  Laterality: N/A;    Family History  Problem Relation Age of Onset   Colon cancer Father     Heart disease Father    Hypertension Father    Melanoma Father    Prostate cancer Father    Alzheimer's disease Mother    Heart disease Sister    Heart disease Brother    Social History:  reports that he has never smoked. He has never used smokeless tobacco. He reports that he does not drink alcohol and does not use drugs.  Allergies:  Allergies  Allergen Reactions   No Known Allergies     Medications Prior to Admission  Medication Sig Dispense Refill   aspirin 81 MG EC tablet Take 1 tablet (81 mg total) by mouth daily. Swallow whole. (Patient taking differently: Take 81 mg by mouth every other day. In the morning. Swallow whole.) 30 tablet 12   atenolol (TENORMIN) 25 MG tablet Take 25 mg by mouth at bedtime.     atorvastatin (LIPITOR) 20 MG tablet TAKE 1 TABLET BY MOUTH DAILY (Patient taking differently: Take 20 mg by mouth at bedtime.) 30 tablet 6   carboxymethylcellulose (REFRESH PLUS) 0.5 % SOLN Place 1 drop into both eyes 3 (three) times daily as needed (for dry/irritated/watery eyes.).     Cholecalciferol (VITAMIN D-3) 125 MCG (5000 UT) TABS Take 5,000 Units by mouth 3 (three) times a week.     Chromium 1000 MCG TABS Take 1,000 mcg by mouth in the morning.  CINNAMON PO Take 1,000 mg by mouth in the morning and at bedtime.     Cyanocobalamin (VITAMIN B-12) 2500 MCG SUBL Take 2,500 mcg by mouth in the morning.     folic acid (FOLVITE) 982 MCG tablet Take 400 mcg by mouth in the morning.     ipratropium (ATROVENT) 0.06 % nasal spray Place 2 sprays into both nostrils 3 (three) times daily as needed (NASAL CONGESTION/ALLERGIES.).      Lactobacillus (ACIDOPHILUS PO) Take 1 capsule by mouth in the morning.     metFORMIN (GLUCOPHAGE-XR) 500 MG 24 hr tablet Take 500 mg by mouth at bedtime.      nabumetone (RELAFEN) 750 MG tablet Take 750 mg by mouth 2 (two) times daily.      pantoprazole (PROTONIX) 40 MG tablet Take 1 tablet (40 mg total) by mouth in the morning. 90 tablet 1    polyethylene glycol-electrolytes (TRILYTE) 420 g solution Take 4,000 mLs by mouth as directed. 4000 mL 0   pyridOXINE (VITAMIN B-6) 100 MG tablet Take 100 mg by mouth in the morning.     simethicone (MYLICON) 641 MG chewable tablet Chew 125 mg by mouth every 6 (six) hours as needed for flatulence.     tamsulosin (FLOMAX) 0.4 MG CAPS capsule Take 0.4 mg by mouth 2 (two) times daily.   4   Zinc 50 MG TABS Take 50 mg by mouth in the morning.     Red Yeast Rice Extract 600 MG CAPS Take 600 mg by mouth every evening.      Results for orders placed or performed during the hospital encounter of 11/14/20 (from the past 48 hour(s))  Glucose, capillary     Status: Abnormal   Collection Time: 11/14/20  9:29 AM  Result Value Ref Range   Glucose-Capillary 145 (H) 70 - 99 mg/dL    Comment: Glucose reference range applies only to samples taken after fasting for at least 8 hours.   No results found.  Review of Systems  Blood pressure (!) 165/85, pulse 90, temperature 97.8 F (36.6 C), temperature source Oral, resp. rate 16, height 5' 9.5" (1.765 m), weight 88.5 kg, SpO2 98 %. Physical Exam Eyes:     General: No scleral icterus.    Conjunctiva/sclera: Conjunctivae normal.  Cardiovascular:     Rate and Rhythm: Normal rate and regular rhythm.     Heart sounds: Normal heart sounds. No murmur heard.    Comments: Midsternal scar. Pulmonary:     Effort: Pulmonary effort is normal.     Breath sounds: Normal breath sounds.  Abdominal:     General: There is no distension.     Palpations: Abdomen is soft. There is no mass.     Tenderness: There is no abdominal tenderness.  Musculoskeletal:     Cervical back: Neck supple.  Lymphadenopathy:     Cervical: No cervical adenopathy.  Neurological:     Mental Status: He is alert.     Assessment/Plan  History of colonic polyps. Family history of colon carcinoma in a first-degree relative at late onset Surveillance colonoscopy.  Hildred Laser,  MD 11/14/2020, 10:56 AM

## 2020-11-14 NOTE — Discharge Instructions (Signed)
Resume aspirin on 11/15/2020 Resume other medications as before. Modified carb high-fiber diet. No driving for 24 hours. Physician will call with biopsy results.

## 2020-11-15 LAB — SURGICAL PATHOLOGY

## 2020-11-21 ENCOUNTER — Encounter (HOSPITAL_COMMUNITY): Payer: Self-pay | Admitting: Internal Medicine

## 2020-12-13 DIAGNOSIS — E1165 Type 2 diabetes mellitus with hyperglycemia: Secondary | ICD-10-CM | POA: Diagnosis not present

## 2020-12-17 DIAGNOSIS — H2513 Age-related nuclear cataract, bilateral: Secondary | ICD-10-CM | POA: Diagnosis not present

## 2020-12-17 DIAGNOSIS — E119 Type 2 diabetes mellitus without complications: Secondary | ICD-10-CM | POA: Diagnosis not present

## 2020-12-17 DIAGNOSIS — H35373 Puckering of macula, bilateral: Secondary | ICD-10-CM | POA: Diagnosis not present

## 2020-12-17 DIAGNOSIS — H35033 Hypertensive retinopathy, bilateral: Secondary | ICD-10-CM | POA: Diagnosis not present

## 2020-12-17 DIAGNOSIS — H04123 Dry eye syndrome of bilateral lacrimal glands: Secondary | ICD-10-CM | POA: Diagnosis not present

## 2020-12-20 DIAGNOSIS — M19049 Primary osteoarthritis, unspecified hand: Secondary | ICD-10-CM | POA: Diagnosis not present

## 2020-12-20 DIAGNOSIS — M2550 Pain in unspecified joint: Secondary | ICD-10-CM | POA: Diagnosis not present

## 2020-12-20 DIAGNOSIS — Z299 Encounter for prophylactic measures, unspecified: Secondary | ICD-10-CM | POA: Diagnosis not present

## 2020-12-20 DIAGNOSIS — E1165 Type 2 diabetes mellitus with hyperglycemia: Secondary | ICD-10-CM | POA: Diagnosis not present

## 2020-12-20 DIAGNOSIS — I10 Essential (primary) hypertension: Secondary | ICD-10-CM | POA: Diagnosis not present

## 2021-01-15 DIAGNOSIS — E1165 Type 2 diabetes mellitus with hyperglycemia: Secondary | ICD-10-CM | POA: Diagnosis not present

## 2021-02-14 DIAGNOSIS — E1165 Type 2 diabetes mellitus with hyperglycemia: Secondary | ICD-10-CM | POA: Diagnosis not present

## 2021-02-18 ENCOUNTER — Other Ambulatory Visit: Payer: Self-pay

## 2021-02-18 ENCOUNTER — Ambulatory Visit (INDEPENDENT_AMBULATORY_CARE_PROVIDER_SITE_OTHER): Payer: Medicare Other | Admitting: Internal Medicine

## 2021-02-18 ENCOUNTER — Encounter (INDEPENDENT_AMBULATORY_CARE_PROVIDER_SITE_OTHER): Payer: Self-pay | Admitting: Internal Medicine

## 2021-02-18 VITALS — BP 138/63 | HR 71 | Temp 98.5°F | Ht 69.5 in | Wt 198.8 lb

## 2021-02-18 DIAGNOSIS — I25119 Atherosclerotic heart disease of native coronary artery with unspecified angina pectoris: Secondary | ICD-10-CM | POA: Diagnosis not present

## 2021-02-18 DIAGNOSIS — K219 Gastro-esophageal reflux disease without esophagitis: Secondary | ICD-10-CM

## 2021-02-18 MED ORDER — PANTOPRAZOLE SODIUM 40 MG PO TBEC: 40.0000 mg | DELAYED_RELEASE_TABLET | Freq: Every morning | ORAL | 2 refills | Status: DC

## 2021-02-18 NOTE — Progress Notes (Signed)
Presenting complaint;  Follow for chronic GERD.  Database and subjective:  Patient is 75 year old Caucasian male who has a history of GERD and colonic polyps who is here for scheduled visit.  He was last seen in the office 1 year ago but he did undergo surveillance colonoscopy in June this year and the study was negative for adenomatous polyps. He remains on pantoprazole 40 mg daily.  He did have EGD back in November 2019 and he did not have erosive esophagitis or Barrett's esophagus.  He states he is doing well.  He rarely has heartburn.  He watches his diet.  He denies hoarseness chronic cough sore throat or dysphagia.  He is interested in decreasing PPI dose.  He is watching his diet.  He drinks no more than 1 or 2 cups of coffee per day but he never drinks carbonated drinks.  He has been on Relafen for more than 5 years.  He has never experienced any side effects.  He always takes it with food.  He has had arthritis involving both his hands worse on the right side.  He feels Relafen is not working as well as it did in the past.  Dr. Woody Seller has referred him to a rheumatologist and he has not heard from their office yet. His bowels move daily.  He denies melena or rectal bleeding.  He remains very active.  He rides a stationary bike for 30 to 45 minutes at least 3 times a week.  Current Medications: Outpatient Encounter Medications as of 02/18/2021  Medication Sig   aspirin 81 MG EC tablet Take 1 tablet (81 mg total) by mouth daily. Swallow whole. (Patient taking differently: Take 81 mg by mouth every other day. In the morning. Swallow whole.)   atenolol (TENORMIN) 25 MG tablet Take 25 mg by mouth at bedtime.   atorvastatin (LIPITOR) 20 MG tablet TAKE 1 TABLET BY MOUTH DAILY (Patient taking differently: Take 20 mg by mouth at bedtime.)   carboxymethylcellulose (REFRESH PLUS) 0.5 % SOLN Place 1 drop into both eyes 3 (three) times daily as needed (for dry/irritated/watery eyes.).   Cholecalciferol  (VITAMIN D-3) 125 MCG (5000 UT) TABS Take 5,000 Units by mouth 3 (three) times a week.   CINNAMON PO Take 1,000 mg by mouth in the morning and at bedtime.   Cyanocobalamin (VITAMIN B-12) 2500 MCG SUBL Take 2,500 mcg by mouth in the morning.   folic acid (FOLVITE) 202 MCG tablet Take 400 mcg by mouth in the morning.   ipratropium (ATROVENT) 0.06 % nasal spray Place 2 sprays into both nostrils 3 (three) times daily as needed (NASAL CONGESTION/ALLERGIES.).    Lactobacillus (ACIDOPHILUS PO) Take 1 capsule by mouth in the morning.   metFORMIN (GLUCOPHAGE-XR) 500 MG 24 hr tablet Take 500 mg by mouth at bedtime.    nabumetone (RELAFEN) 750 MG tablet Take 750 mg by mouth 2 (two) times daily.    pantoprazole (PROTONIX) 40 MG tablet Take 1 tablet (40 mg total) by mouth in the morning.   pyridOXINE (VITAMIN B-6) 100 MG tablet Take 100 mg by mouth in the morning.   Red Yeast Rice Extract 600 MG CAPS Take 600 mg by mouth every evening.   simethicone (MYLICON) 542 MG chewable tablet Chew 125 mg by mouth every 6 (six) hours as needed for flatulence.   tamsulosin (FLOMAX) 0.4 MG CAPS capsule Take 0.4 mg by mouth 2 (two) times daily.    Zinc 50 MG TABS Take 50 mg by mouth in the  morning.   [DISCONTINUED] Chromium 1000 MCG TABS Take 1,000 mcg by mouth in the morning.   [DISCONTINUED] polyethylene glycol-electrolytes (TRILYTE) 420 g solution Take 4,000 mLs by mouth as directed.   No facility-administered encounter medications on file as of 02/18/2021.     Objective: Blood pressure 138/63, pulse 71, temperature 98.5 F (36.9 C), temperature source Oral, height 5' 9.5" (1.765 m), weight 198 lb 12.8 oz (90.2 kg). Patient is alert and in no acute distress Conjunctiva is pink. Sclera is nonicteric Oropharyngeal mucosa is normal. No neck masses or thyromegaly noted. Cardiac exam with regular rhythm normal S1 and S2. No murmur or gallop noted. Lungs are clear to auscultation. Abdomen is soft and nontender with  organomegaly or masses. No LE edema or clubbing noted. First and second metacarpophalangeal joints and both hands are expanded and mildly tender.  Labs/studies Results:      Assessment:  #1.  Chronic GERD.  He is doing well with antireflux measures and PPI.  Would like for him to trial an PPI every other day but he can wait until January 2023 and then change dose.  He can take famotidine or OTC antacids on off days for breakthrough symptoms.  #2.  History of colonic polyps.  He had colonoscopy in June 2022 and both polyps are non-adenomatous.  Family history significant for CRC in father at age 52.  He does not have to worry about another colonoscopy in 5 years and will never need one.  We can discuss with him when the time comes.  Plan:  New prescription for pantoprazole sent to patient's pharmacy. He will continue 40 mg daily until January 2023 when he would change dose to 40 mg every other day. He can use OTC antacids or famotidine 20 mg for breakthrough symptoms on off days. Patient advised to call office with progress report in February or March 2023. Office visit in 1 year.

## 2021-02-18 NOTE — Patient Instructions (Signed)
Starting in January 2023 try taking pantoprazole every other day.  You can use OTC Pepcid/famotidine 20 mg on off days if you have heartburn.  Can also use Tums or Rolaids. Please call us with progress report and February or March 2023

## 2021-02-28 ENCOUNTER — Ambulatory Visit: Payer: Medicare Other | Admitting: Rheumatology

## 2021-03-17 DIAGNOSIS — E1165 Type 2 diabetes mellitus with hyperglycemia: Secondary | ICD-10-CM | POA: Diagnosis not present

## 2021-03-18 NOTE — Progress Notes (Signed)
Office Visit Note  Patient: Joseph Burton             Date of Birth: May 16, 1946           MRN: [id number]             PCP: Glenda Chroman, MD Referring: Glenda Chroman, MD Visit Date: 04/01/2021 Occupation: '@GUAROCC' @  Subjective:  New Patient (Initial Visit) (Bil hand pain, right 1st, 2nd and 3rd digit swelling)   History of Present Illness: Joseph Burton is a 75 y.o. male seen in consultation per request of his PCP.  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  He retired in 2012 as he was having pain and discomfort in his left shoulder.  He underwent left total shoulder replacement in 2012.  He states he is also had discomfort in his both hands since then.  The right hand has been gradually getting worse.  He notices swelling in his right hand which she describes especially in his right second and third fingers.  He also has discomfort in his right shoulder.  He denies any discomfort in his hips, knees, ankles and his feet.  He denies any personal history of psoriasis.  There is no history of Achilles tendinitis, Planter fasciitis or uveitis.  He states he has occasional lower back pain when he does yard work.  There is no family history of rheumatoid arthritis or psoriasis.  Activities of Daily Living:  Patient reports morning stiffness for 5 minutes.   Patient Reports nocturnal pain.  Difficulty dressing/grooming: Denies Difficulty climbing stairs: Denies Difficulty getting out of chair: Denies Difficulty using hands for taps, buttons, cutlery, and/or writing: Denies  Review of Systems  Constitutional:  Positive for fatigue. Negative for night sweats.  HENT:  Positive for mouth dryness. Negative for mouth sores and nose dryness.   Eyes:  Positive for dryness. Negative for redness.  Respiratory:  Negative for shortness of breath and difficulty breathing.   Cardiovascular:  Negative for chest pain, palpitations, hypertension, irregular heartbeat  and swelling in legs/feet.  Gastrointestinal:  Positive for constipation. Negative for diarrhea.  Endocrine: Positive for excessive thirst and increased urination.  Genitourinary:  Negative for difficulty urinating.  Musculoskeletal:  Positive for joint pain, joint pain, joint swelling and morning stiffness. Negative for gait problem, myalgias, muscle weakness, muscle tenderness and myalgias.  Skin:  Negative for color change, rash, hair loss, nodules/bumps, skin tightness, ulcers and sensitivity to sunlight.  Allergic/Immunologic: Negative for susceptible to infections.  Neurological:  Positive for weakness. Negative for dizziness, fainting, memory loss and night sweats.  Hematological:  Positive for bruising/bleeding tendency. Negative for swollen glands.  Psychiatric/Behavioral:  Negative for depressed mood and sleep disturbance. The patient is not nervous/anxious.    PMFS History:  Patient Active Problem List   Diagnosis Date Noted   Chronic heart failure with preserved ejection fraction ([place]) 07/09/2020   Primary hypertension 07/09/2020   OSA on CPAP 07/09/2020   Mixed hyperlipidemia 07/09/2020   Coronary artery disease involving native coronary artery of native heart with angina pectoris ([place]) 07/09/2020   Type 2 diabetes mellitus without complication, without long-term current use of insulin ([place]) 07/09/2020   Gastroesophageal reflux disease without esophagitis 03/17/2018   Bloating 03/16/2018    Past Medical History:  Diagnosis Date   Arthritis    Bloating 03/16/2018   Coronary artery disease    Multivessel status post CABG 2002   Early cataracts, bilateral  Environmental and seasonal allergies    Fatty liver    GERD (gastroesophageal reflux disease)    Hearing loss    Hypertension    Sleep apnea    Type 2 diabetes mellitus ([place])    Umbilical hernia     Family History  Problem Relation Age of Onset   Alzheimer's disease Mother    Colon cancer Father    Heart  disease Father    Hypertension Father    Melanoma Father    Prostate cancer Father    Heart disease Sister    Heart disease Brother    Past Surgical History:  Procedure Laterality Date   CARDIAC CATHETERIZATION     2002   CHOLECYSTECTOMY     3 years ago   COLONOSCOPY N/A 10/02/2015   Procedure: COLONOSCOPY;  Surgeon: Rogene Houston, MD;  [facility];  Service: Endoscopy;  Laterality: N/A;  930   COLONOSCOPY N/A 11/14/2020   Procedure: COLONOSCOPY;  Surgeon: Rogene Houston, MD;  [facility];  Service: Endoscopy;  Laterality: N/A;  [place]   CORONARY ARTERY BYPASS GRAFT  2002   2002   ESOPHAGOGASTRODUODENOSCOPY N/A 03/28/2018   Procedure: ESOPHAGOGASTRODUODENOSCOPY (EGD);  Surgeon: Rogene Houston, MD;  [facility];  Service: Endoscopy;  Laterality: N/A;  2:20   INSERTION OF MESH N/A 05/29/2016   Procedure: INSERTION OF MESH;  Surgeon: Ralene Ok, MD;  Location: [facility];  Service: General;  Laterality: N/A;   JOINT REPLACEMENT     Left shoulder   POLYPECTOMY  11/14/2020   Procedure: POLYPECTOMY;  Surgeon: Rogene Houston, MD;  [facility];  Service: Endoscopy;;   SHOULDER ARTHROSCOPY W/ ROTATOR CUFF REPAIR Left 07/2010   TONSILLECTOMY  1969   TREATMENT FISTULA ANAL     UMBILICAL HERNIA REPAIR N/A 05/29/2016   Procedure: LAPAROSCOPIC UMBILICAL HERNIA;  Surgeon: Ralene Ok, MD;  Location: [facility];  Service: General;  Laterality: N/A;   Social History   Social History Narrative   Not on file   Immunization History  Administered Date(s) Administered   Moderna Sars-Covid-2 Vaccination 01/17/2019     Objective: Vital Signs: BP (!) 149/70 (BP Location: Right Arm, Patient Position: Sitting, Cuff Size: Normal)   Pulse 73   Resp 16   Ht '5\' 9"'  (1.753 m)   Wt 195 lb (88.5 kg)   BMI 28.80 kg/m    Physical Exam Vitals and nursing note reviewed.  Constitutional:      Appearance: He is well-developed.  HENT:     Head:  Normocephalic and atraumatic.  Eyes:     Conjunctiva/sclera: Conjunctivae normal.     Pupils: Pupils are equal, round, and reactive to light.  Cardiovascular:     Rate and Rhythm: Normal rate and regular rhythm.     Heart sounds: Normal heart sounds.  Pulmonary:     Effort: Pulmonary effort is normal.     Breath sounds: Normal breath sounds.  Abdominal:     General: Bowel sounds are normal.     Palpations: Abdomen is soft.  Musculoskeletal:     Cervical back: Normal range of motion and neck supple.  Skin:    General: Skin is warm and dry.     Capillary Refill: Capillary refill takes less than 2 seconds.  Neurological:     Mental Status: He is alert and oriented to person, place, and time.  Psychiatric:        Behavior: Behavior normal.  Musculoskeletal Exam: C-spine was in good range of motion.  Right shoulder joint was in full range of motion.  Left shoulder joint is replaced with forward flexion of 100 degrees abduction of 70 degrees and limited internal rotation.  No synovitis was noted.  Elbow joints were in full range of motion.  He had limited extension of bilateral wrist joints.  There was no synovitis of her MCPs.  Bilateral PIP and DIP thickening with no synovitis was noted.  No nail dystrophy was noted.  Hip joints and knee joints with good range of motion without any warmth swelling or effusion.  There was no tenderness over ankles and MTPs.  Some PIP and DIP thickening in his feet was noted.  CDAI Exam: CDAI Score: -- Patient Global: --; Provider Global: -- Swollen: --; Tender: -- Joint Exam 04/01/2021   No joint exam has been documented for this visit   There is currently no information documented on the homunculus. Go to the Rheumatology activity and complete the homunculus joint exam.  Investigation: No additional findings.  Imaging: XR Hand 2 View Left  Result Date: 04/01/2021 CMC, PIP and DIP narrowing was noted.  Third MCP narrowing was noted.  No  intercarpal radiocarpal joint space narrowing was noted.  No chondrocalcinosis was noted. Impression: These findings are consistent with osteoarthritis of the hand.  XR Hand 2 View Right  Result Date: 04/01/2021 CMC, PIP and DIP narrowing was noted.  Third MCP narrowing with osteophyte formation was noted.  No intercarpal or radiocarpal joint space narrowing was noted.  No erosive changes were noted. Impression: These findings are consistent with osteoarthritis of the hand.   Recent Labs: Lab Results  Component Value Date   WBC 4.4 05/25/2016   HGB 14.4 05/25/2016   PLT 167 05/25/2016   NA 141 05/25/2016   K 4.5 05/25/2016   CL 111 05/25/2016   CO2 23 05/25/2016   GLUCOSE 183 (H) 05/25/2016   BUN 14 05/25/2016   CREATININE 0.83 05/25/2016   BILITOT 0.9 05/25/2016   ALKPHOS 57 05/25/2016   AST 22 05/25/2016   ALT 25 05/25/2016   PROT 6.9 05/25/2016   ALBUMIN 4.2 05/25/2016   CALCIUM 9.9 05/25/2016   GFRAA >60 05/25/2016    Speciality Comments: No specialty comments available.  Procedures:  No procedures performed Allergies: No known allergies   Assessment / Plan:     Visit Diagnoses: Pain in both hands -he complains of pain and discomfort in his bilateral hands especially his right hand.  He also gives history of intermittent swelling in his right hand.  He had no synovitis on examination.  He has been taking nabumetone 750 mg p.o. twice daily.  I advised him to stop nabumetone so I can witness inflammation when he comes for follow-up visit.  I reviewed his labs.  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  Sedimentation rate was normal.  12/20/20: RF<10, Anti-CCP 74, ESR 3 - Plan: XR Hand 2 View Right, XR Hand 2 View Left.  X-rays obtained of bilateral hands were consistent with osteoarthritis.  Bilateral third MCP narrowing with osteophyte formation was noted.  This is most likely due to the manual labor which she performed for 15 years.  I would like  to reexamine him at the follow-up visit after stopping anti-inflammatories.  I also advised him to contact me in case he develops increased swelling.  Primary osteoarthritis of both hands-joint protection muscle strengthening was discussed.  H/O total shoulder replacement,  left - 2012.  Patient has history of manual labor for 15 years.  He has limited forward flexion, abduction and internal rotation of his left shoulder.  He complains of some discomfort in his right shoulder.  His right shoulder joint was in good range of motion.  Other fatigue-gives history of fatigue for many years.  Essential hypertension-his blood pressure was elevated today.  Has been advised to monitor blood pressure closely and follow-up with his PCP.  Hypercholesteremia-he is being treated with Lipitor.  Coronary artery disease involving native coronary artery of native heart with angina pectoris (HCC)  S/P CABG (coronary artery bypass graft)  Chronic heart failure with preserved ejection fraction (HCC)  Type 2 diabetes mellitus without complication, without long-term current use of insulin (HCC)  History of diverticulosis  Gastroesophageal reflux disease without esophagitis  OSA on CPAP  Orders: Orders Placed This Encounter  Procedures   XR Hand 2 View Right   XR Hand 2 View Left    No orders of the defined types were placed in this encounter.    Follow-Up Instructions: Return for Pain in both hands.   Bo Merino, MD  Note - This record has been created using Editor, commissioning.  Chart creation errors have been sought, but may not always  have been located. Such creation errors do not reflect on  the standard of medical care.

## 2021-03-26 ENCOUNTER — Ambulatory Visit: Payer: Medicare Other | Admitting: Rheumatology

## 2021-03-31 DIAGNOSIS — N400 Enlarged prostate without lower urinary tract symptoms: Secondary | ICD-10-CM | POA: Diagnosis not present

## 2021-03-31 DIAGNOSIS — Z299 Encounter for prophylactic measures, unspecified: Secondary | ICD-10-CM | POA: Diagnosis not present

## 2021-03-31 DIAGNOSIS — Z6829 Body mass index (BMI) 29.0-29.9, adult: Secondary | ICD-10-CM | POA: Diagnosis not present

## 2021-03-31 DIAGNOSIS — H938X1 Other specified disorders of right ear: Secondary | ICD-10-CM | POA: Diagnosis not present

## 2021-03-31 DIAGNOSIS — I10 Essential (primary) hypertension: Secondary | ICD-10-CM | POA: Diagnosis not present

## 2021-03-31 DIAGNOSIS — E1165 Type 2 diabetes mellitus with hyperglycemia: Secondary | ICD-10-CM | POA: Diagnosis not present

## 2021-03-31 DIAGNOSIS — J310 Chronic rhinitis: Secondary | ICD-10-CM | POA: Diagnosis not present

## 2021-03-31 DIAGNOSIS — H6120 Impacted cerumen, unspecified ear: Secondary | ICD-10-CM | POA: Diagnosis not present

## 2021-04-01 ENCOUNTER — Ambulatory Visit (INDEPENDENT_AMBULATORY_CARE_PROVIDER_SITE_OTHER): Payer: Medicare Other | Admitting: Rheumatology

## 2021-04-01 ENCOUNTER — Other Ambulatory Visit: Payer: Self-pay

## 2021-04-01 ENCOUNTER — Ambulatory Visit: Payer: Self-pay

## 2021-04-01 ENCOUNTER — Encounter: Payer: Self-pay | Admitting: Rheumatology

## 2021-04-01 VITALS — BP 149/70 | HR 73 | Resp 16 | Ht 69.0 in | Wt 195.0 lb

## 2021-04-01 DIAGNOSIS — M19042 Primary osteoarthritis, left hand: Secondary | ICD-10-CM

## 2021-04-01 DIAGNOSIS — Z96612 Presence of left artificial shoulder joint: Secondary | ICD-10-CM | POA: Diagnosis not present

## 2021-04-01 DIAGNOSIS — M79642 Pain in left hand: Secondary | ICD-10-CM

## 2021-04-01 DIAGNOSIS — I5032 Chronic diastolic (congestive) heart failure: Secondary | ICD-10-CM | POA: Diagnosis not present

## 2021-04-01 DIAGNOSIS — M79641 Pain in right hand: Secondary | ICD-10-CM | POA: Diagnosis not present

## 2021-04-01 DIAGNOSIS — Z951 Presence of aortocoronary bypass graft: Secondary | ICD-10-CM

## 2021-04-01 DIAGNOSIS — M0609 Rheumatoid arthritis without rheumatoid factor, multiple sites: Secondary | ICD-10-CM

## 2021-04-01 DIAGNOSIS — E119 Type 2 diabetes mellitus without complications: Secondary | ICD-10-CM

## 2021-04-01 DIAGNOSIS — M19041 Primary osteoarthritis, right hand: Secondary | ICD-10-CM | POA: Diagnosis not present

## 2021-04-01 DIAGNOSIS — Z8719 Personal history of other diseases of the digestive system: Secondary | ICD-10-CM | POA: Diagnosis not present

## 2021-04-01 DIAGNOSIS — I10 Essential (primary) hypertension: Secondary | ICD-10-CM

## 2021-04-01 DIAGNOSIS — E7800 Pure hypercholesterolemia, unspecified: Secondary | ICD-10-CM

## 2021-04-01 DIAGNOSIS — K219 Gastro-esophageal reflux disease without esophagitis: Secondary | ICD-10-CM

## 2021-04-01 DIAGNOSIS — G4733 Obstructive sleep apnea (adult) (pediatric): Secondary | ICD-10-CM

## 2021-04-01 DIAGNOSIS — R5383 Other fatigue: Secondary | ICD-10-CM

## 2021-04-01 DIAGNOSIS — I25119 Atherosclerotic heart disease of native coronary artery with unspecified angina pectoris: Secondary | ICD-10-CM | POA: Diagnosis not present

## 2021-04-01 DIAGNOSIS — Z9989 Dependence on other enabling machines and devices: Secondary | ICD-10-CM

## 2021-04-01 NOTE — Patient Instructions (Signed)
Please discontinue nabumetone until your follow-up visit.

## 2021-04-16 DIAGNOSIS — E1165 Type 2 diabetes mellitus with hyperglycemia: Secondary | ICD-10-CM | POA: Diagnosis not present

## 2021-04-27 NOTE — Progress Notes (Signed)
Office Visit Note  Patient: Joseph Burton             Date of Birth: [birth year]           MRN: [id number]             PCP: Glenda Chroman, MD Referring: Glenda Chroman, MD Visit Date: 04/28/2021 Occupation: _0 @  Subjective:  Pain and stiffness in bilateral hands   History of Present Illness: Joseph Burton is a 75 y.o. male with a history of pain in bilateral hands and positive anti-CCP antibody.  He states he has stiffness in his bilateral hands and difficulty making a fist.  He states the pain is worse especially in the morning which gets better during the day.  The right hand is worse than the left hand.  None of the other joints are painful.  Activities of Daily Living:  Patient reports morning stiffness for 24 hours.   Patient Reports nocturnal pain.  Difficulty dressing/grooming: Denies Difficulty climbing stairs: Denies Difficulty getting out of chair: Denies Difficulty using hands for taps, buttons, cutlery, and/or writing: Denies  Review of Systems  Constitutional:  Positive for fatigue.  HENT:  Positive for mouth dryness.   Eyes:  Positive for dryness.  Respiratory:  Negative for shortness of breath.   Cardiovascular:  Negative for swelling in legs/feet.  Gastrointestinal:  Negative for constipation.  Endocrine: Positive for cold intolerance and increased urination.  Genitourinary:  Negative for difficulty urinating.  Musculoskeletal:  Positive for joint pain, joint pain, joint swelling, muscle weakness and morning stiffness.  Skin:  Negative for rash.  Allergic/Immunologic: Negative for susceptible to infections.  Neurological:  Positive for weakness.  Hematological:  Positive for bruising/bleeding tendency.  Psychiatric/Behavioral:  Positive for sleep disturbance.    PMFS History:  Patient Active Problem List   Diagnosis Date Noted   Chronic heart failure with preserved ejection fraction ([place]) 07/09/2020   Primary hypertension 07/09/2020    OSA on CPAP 07/09/2020   Mixed hyperlipidemia 07/09/2020   Coronary artery disease involving native coronary artery of native heart with angina pectoris ([place]) 07/09/2020   Type 2 diabetes mellitus without complication, without long-term current use of insulin ([place]) 07/09/2020   Gastroesophageal reflux disease without esophagitis 03/17/2018   Bloating 03/16/2018    Past Medical History:  Diagnosis Date   Arthritis    Bloating 03/16/2018   Coronary artery disease    Multivessel status post CABG 2002   Early cataracts, bilateral    Environmental and seasonal allergies    Fatty liver    GERD (gastroesophageal reflux disease)    Hearing loss    Hypertension    Sleep apnea    Type 2 diabetes mellitus ([place])    Umbilical hernia     Family History  Problem Relation Age of Onset   Alzheimer's disease Mother    Colon cancer Father    Heart disease Father    Hypertension Father    Melanoma Father    Prostate cancer Father    Heart disease Sister    Heart disease Brother    Past Surgical History:  Procedure Laterality Date   CARDIAC CATHETERIZATION     2002   CHOLECYSTECTOMY     3 years ago   COLONOSCOPY N/A 10/02/2015   Procedure: COLONOSCOPY;  Surgeon: Rogene Houston, MD;  [facility];  Service: Endoscopy;  Laterality: N/A;  930   COLONOSCOPY N/A 11/14/2020   Procedure: COLONOSCOPY;  Surgeon: Hildred Laser  U, MD;  [facility];  Service: Endoscopy;  Laterality: N/A;  [place]   CORONARY ARTERY BYPASS GRAFT  2002   2002   ESOPHAGOGASTRODUODENOSCOPY N/A 03/28/2018   Procedure: ESOPHAGOGASTRODUODENOSCOPY (EGD);  Surgeon: Rogene Houston, MD;  [facility];  Service: Endoscopy;  Laterality: N/A;  2:20   INSERTION OF MESH N/A 05/29/2016   Procedure: INSERTION OF MESH;  Surgeon: Ralene Ok, MD;  Location: [facility];  Service: General;  Laterality: N/A;   JOINT REPLACEMENT     Left shoulder   POLYPECTOMY  11/14/2020   Procedure: POLYPECTOMY;  Surgeon:  Rogene Houston, MD;  [facility];  Service: Endoscopy;;   SHOULDER ARTHROSCOPY W/ ROTATOR CUFF REPAIR Left 07/2010   TONSILLECTOMY  1969   TREATMENT FISTULA ANAL     UMBILICAL HERNIA REPAIR N/A 05/29/2016   Procedure: LAPAROSCOPIC UMBILICAL HERNIA;  Surgeon: Ralene Ok, MD;  Location: [facility];  Service: General;  Laterality: N/A;   Social History   Social History Narrative   Not on file   Immunization History  Administered Date(s) Administered   Moderna Sars-Covid-2 Vaccination 01/17/2019     Objective: Vital Signs: BP 125/64 (BP Location: Left Arm, Patient Position: Sitting, Cuff Size: Normal)   Pulse 72   Resp 16   Ht 5' 9" (1.753 m)   Wt 201 lb (91.2 kg)   BMI 29.68 kg/m    Physical Exam Vitals and nursing note reviewed.  Constitutional:      Appearance: He is well-developed.  HENT:     Head: Normocephalic and atraumatic.  Eyes:     Conjunctiva/sclera: Conjunctivae normal.     Pupils: Pupils are equal, round, and reactive to light.  Cardiovascular:     Rate and Rhythm: Normal rate and regular rhythm.     Heart sounds: Normal heart sounds.  Pulmonary:     Effort: Pulmonary effort is normal.     Breath sounds: Normal breath sounds.  Abdominal:     General: Bowel sounds are normal.     Palpations: Abdomen is soft.  Musculoskeletal:     Cervical back: Normal range of motion and neck supple.  Skin:    General: Skin is warm and dry.     Capillary Refill: Capillary refill takes less than 2 seconds.  Neurological:     Mental Status: He is alert and oriented to person, place, and time.  Psychiatric:        Behavior: Behavior normal.     Musculoskeletal Exam: He had a stiffness range of motion of the cervical spine.  Left shoulder joint abduction was limited due to left shoulder joint replacement.  Right shoulder joints with full range of motion.  Elbow joints were in good range of motion.  He had limited extension of bilateral wrist joints.No synovitis  was noted over wrist joints.  He had bilateral PIP and DIP thickening.  He had tenderness on palpation over his right second PIP joint.  Hip joints with good range of motion.  There was no tenderness over knee joints.  There was no tenderness over ankles or MTPs.  CDAI Exam: CDAI Score: -- Patient Global: --; Provider Global: -- Swollen: --; Tender: -- Joint Exam 04/28/2021   No joint exam has been documented for this visit   There is currently no information documented on the homunculus. Go to the Rheumatology activity and complete the homunculus joint exam.  Investigation: No additional findings.  Imaging: XR Hand 2 View Left  Result  Date: 04/01/2021 CMC, PIP and DIP narrowing was noted.  Third MCP narrowing was noted.  No intercarpal radiocarpal joint space narrowing was noted.  No chondrocalcinosis was noted. Impression: These findings are consistent with osteoarthritis of the hand.  XR Hand 2 View Right  Result Date: 04/01/2021 CMC, PIP and DIP narrowing was noted.  Third MCP narrowing with osteophyte formation was noted.  No intercarpal or radiocarpal joint space narrowing was noted.  No erosive changes were noted. Impression: These findings are consistent with osteoarthritis of the hand.   Recent Labs: Lab Results  Component Value Date   WBC 4.4 05/25/2016   HGB 14.4 05/25/2016   PLT 167 05/25/2016   NA 141 05/25/2016   K 4.5 05/25/2016   CL 111 05/25/2016   CO2 23 05/25/2016   GLUCOSE 183 (H) 05/25/2016   BUN 14 05/25/2016   CREATININE 0.83 05/25/2016   BILITOT 0.9 05/25/2016   ALKPHOS 57 05/25/2016   AST 22 05/25/2016   ALT 25 05/25/2016   PROT 6.9 05/25/2016   ALBUMIN 4.2 05/25/2016   CALCIUM 9.9 05/25/2016   GFRAA >60 05/25/2016    12/20/20: RF<10, Anti-CCP 74, ESR 3   Speciality Comments: No specialty comments available.  Procedures:  No procedures performed Allergies: No known allergies   Assessment / Plan:     Visit Diagnoses: Primary  osteoarthritis of both hands - Clinical and radiographic findings were consistent with osteoarthritis.  He gives history of intermittent swelling in his right hand.  Bilateral third MCP narrowing and spurring was noted, which could be related to the manual work he has done in the past.  He had no synovitis on examination.  Although he had tenderness over right second PIP joint.  I advised him to contact me in case he develops any increased swelling.  I also offered ultrasound examination to look for synovitis which he declined.  He also declined physical therapy and Occupational Therapy.  A handout on exercises was given.  Joint protection muscle strengthening was discussed.  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  Patient was advised to come off NSAIDs for today's visit.  Bilateral third MCP narrowing and osteophytes were   H/O total shoulder replacement, left - 2012.  He had limited forward flexion and internal rotation.  He continues to have chronic discomfort.  Essential hypertension-blood pressure was normal today.  Hypercholesteremia - He is on Lipitor.  Coronary artery disease involving native coronary artery of native heart with angina pectoris (HCC)  S/P CABG (coronary artery bypass graft)  Chronic heart failure with preserved ejection fraction (HCC)  Type 2 diabetes mellitus without complication, without long-term current use of insulin (HCC)  History of diverticulosis  Gastroesophageal reflux disease without esophagitis  OSA on CPAP  Orders: No orders of the defined types were placed in this encounter.  No orders of the defined types were placed in this encounter.    Follow-Up Instructions: Return if symptoms worsen or fail to improve, for Osteoarthritis, positive CCP antibody.   Bo Merino, MD  Note - This record has been created using Editor, commissioning.  Chart creation errors have been sought, but may not always  have been  located. Such creation errors do not reflect on  the standard of medical care.

## 2021-04-28 ENCOUNTER — Other Ambulatory Visit: Payer: Self-pay

## 2021-04-28 ENCOUNTER — Encounter: Payer: Self-pay | Admitting: Rheumatology

## 2021-04-28 ENCOUNTER — Ambulatory Visit (INDEPENDENT_AMBULATORY_CARE_PROVIDER_SITE_OTHER): Payer: Medicare Other | Admitting: Rheumatology

## 2021-04-28 VITALS — BP 125/64 | HR 72 | Resp 16 | Ht 69.0 in | Wt 201.0 lb

## 2021-04-28 DIAGNOSIS — E119 Type 2 diabetes mellitus without complications: Secondary | ICD-10-CM

## 2021-04-28 DIAGNOSIS — Z96612 Presence of left artificial shoulder joint: Secondary | ICD-10-CM | POA: Diagnosis not present

## 2021-04-28 DIAGNOSIS — K219 Gastro-esophageal reflux disease without esophagitis: Secondary | ICD-10-CM

## 2021-04-28 DIAGNOSIS — E7800 Pure hypercholesterolemia, unspecified: Secondary | ICD-10-CM

## 2021-04-28 DIAGNOSIS — Z951 Presence of aortocoronary bypass graft: Secondary | ICD-10-CM | POA: Diagnosis not present

## 2021-04-28 DIAGNOSIS — Z9989 Dependence on other enabling machines and devices: Secondary | ICD-10-CM

## 2021-04-28 DIAGNOSIS — I10 Essential (primary) hypertension: Secondary | ICD-10-CM | POA: Diagnosis not present

## 2021-04-28 DIAGNOSIS — R768 Other specified abnormal immunological findings in serum: Secondary | ICD-10-CM | POA: Diagnosis not present

## 2021-04-28 DIAGNOSIS — I5032 Chronic diastolic (congestive) heart failure: Secondary | ICD-10-CM

## 2021-04-28 DIAGNOSIS — Z8719 Personal history of other diseases of the digestive system: Secondary | ICD-10-CM

## 2021-04-28 DIAGNOSIS — M19042 Primary osteoarthritis, left hand: Secondary | ICD-10-CM

## 2021-04-28 DIAGNOSIS — M19041 Primary osteoarthritis, right hand: Secondary | ICD-10-CM | POA: Diagnosis not present

## 2021-04-28 DIAGNOSIS — G4733 Obstructive sleep apnea (adult) (pediatric): Secondary | ICD-10-CM | POA: Diagnosis not present

## 2021-04-28 DIAGNOSIS — I25119 Atherosclerotic heart disease of native coronary artery with unspecified angina pectoris: Secondary | ICD-10-CM | POA: Diagnosis not present

## 2021-04-30 DIAGNOSIS — Z1331 Encounter for screening for depression: Secondary | ICD-10-CM | POA: Diagnosis not present

## 2021-04-30 DIAGNOSIS — I10 Essential (primary) hypertension: Secondary | ICD-10-CM | POA: Diagnosis not present

## 2021-04-30 DIAGNOSIS — Z125 Encounter for screening for malignant neoplasm of prostate: Secondary | ICD-10-CM | POA: Diagnosis not present

## 2021-04-30 DIAGNOSIS — H938X1 Other specified disorders of right ear: Secondary | ICD-10-CM | POA: Diagnosis not present

## 2021-04-30 DIAGNOSIS — D2321 Other benign neoplasm of skin of right ear and external auricular canal: Secondary | ICD-10-CM | POA: Diagnosis not present

## 2021-04-30 DIAGNOSIS — R5383 Other fatigue: Secondary | ICD-10-CM | POA: Diagnosis not present

## 2021-04-30 DIAGNOSIS — E7800 Pure hypercholesterolemia, unspecified: Secondary | ICD-10-CM | POA: Diagnosis not present

## 2021-04-30 DIAGNOSIS — Z7189 Other specified counseling: Secondary | ICD-10-CM | POA: Diagnosis not present

## 2021-04-30 DIAGNOSIS — Z0000 Encounter for general adult medical examination without abnormal findings: Secondary | ICD-10-CM | POA: Diagnosis not present

## 2021-04-30 DIAGNOSIS — Z79899 Other long term (current) drug therapy: Secondary | ICD-10-CM | POA: Diagnosis not present

## 2021-04-30 DIAGNOSIS — Z299 Encounter for prophylactic measures, unspecified: Secondary | ICD-10-CM | POA: Diagnosis not present

## 2021-04-30 DIAGNOSIS — Z6830 Body mass index (BMI) 30.0-30.9, adult: Secondary | ICD-10-CM | POA: Diagnosis not present

## 2021-04-30 DIAGNOSIS — Z1339 Encounter for screening examination for other mental health and behavioral disorders: Secondary | ICD-10-CM | POA: Diagnosis not present

## 2021-04-30 DIAGNOSIS — E1165 Type 2 diabetes mellitus with hyperglycemia: Secondary | ICD-10-CM | POA: Diagnosis not present

## 2021-05-16 DIAGNOSIS — E1165 Type 2 diabetes mellitus with hyperglycemia: Secondary | ICD-10-CM | POA: Diagnosis not present

## 2021-06-02 ENCOUNTER — Telehealth: Payer: Self-pay

## 2021-06-02 NOTE — Telephone Encounter (Signed)
Patient called stating at his last appointment Dr. Estanislado Pandy discussed Meloxicam, but he denied.  Patient states he has changed his mind and is requesting a prescription sent to [facility] in [place].

## 2021-06-02 NOTE — Telephone Encounter (Signed)
He had no synovitis on my examination.  I do not recommend NSAIDs including meloxicam.  Meloxicam increases the risk of GI bleed, it may elevate liver functions and kidney functions.  I would suggest use of over-the-counter Tylenol for pain management.

## 2021-06-03 NOTE — Telephone Encounter (Signed)
Patient advised per Dr. Estanislado Pandy, he had no synovitis on my examination.  Patient advised Dr. Estanislado Pandy does not recommend NSAIDs including meloxicam.  Meloxicam increases the risk of GI bleed, it may elevate liver functions and kidney functions.  Patient advised Dr. Estanislado Pandy  would suggest use of over-the-counter Tylenol for pain management. Patient expressed understanding.

## 2021-06-15 DIAGNOSIS — E1165 Type 2 diabetes mellitus with hyperglycemia: Secondary | ICD-10-CM | POA: Diagnosis not present

## 2021-06-21 ENCOUNTER — Other Ambulatory Visit: Payer: Self-pay | Admitting: Cardiology

## 2021-06-27 DIAGNOSIS — B356 Tinea cruris: Secondary | ICD-10-CM | POA: Diagnosis not present

## 2021-06-27 DIAGNOSIS — L259 Unspecified contact dermatitis, unspecified cause: Secondary | ICD-10-CM | POA: Diagnosis not present

## 2021-06-27 DIAGNOSIS — Z6829 Body mass index (BMI) 29.0-29.9, adult: Secondary | ICD-10-CM | POA: Diagnosis not present

## 2021-06-27 DIAGNOSIS — L97909 Non-pressure chronic ulcer of unspecified part of unspecified lower leg with unspecified severity: Secondary | ICD-10-CM | POA: Diagnosis not present

## 2021-06-27 DIAGNOSIS — Z299 Encounter for prophylactic measures, unspecified: Secondary | ICD-10-CM | POA: Diagnosis not present

## 2021-07-08 DIAGNOSIS — I700 Atherosclerosis of aorta: Secondary | ICD-10-CM | POA: Diagnosis not present

## 2021-07-08 DIAGNOSIS — I5032 Chronic diastolic (congestive) heart failure: Secondary | ICD-10-CM | POA: Diagnosis not present

## 2021-07-08 DIAGNOSIS — I10 Essential (primary) hypertension: Secondary | ICD-10-CM | POA: Diagnosis not present

## 2021-07-08 DIAGNOSIS — E1165 Type 2 diabetes mellitus with hyperglycemia: Secondary | ICD-10-CM | POA: Diagnosis not present

## 2021-07-08 DIAGNOSIS — Z299 Encounter for prophylactic measures, unspecified: Secondary | ICD-10-CM | POA: Diagnosis not present

## 2021-07-15 DIAGNOSIS — E1165 Type 2 diabetes mellitus with hyperglycemia: Secondary | ICD-10-CM | POA: Diagnosis not present

## 2021-08-14 DIAGNOSIS — E1165 Type 2 diabetes mellitus with hyperglycemia: Secondary | ICD-10-CM | POA: Diagnosis not present

## 2021-10-15 DIAGNOSIS — I5032 Chronic diastolic (congestive) heart failure: Secondary | ICD-10-CM | POA: Diagnosis not present

## 2021-10-15 DIAGNOSIS — E1165 Type 2 diabetes mellitus with hyperglycemia: Secondary | ICD-10-CM | POA: Diagnosis not present

## 2021-10-15 DIAGNOSIS — Z6829 Body mass index (BMI) 29.0-29.9, adult: Secondary | ICD-10-CM | POA: Diagnosis not present

## 2021-10-15 DIAGNOSIS — I10 Essential (primary) hypertension: Secondary | ICD-10-CM | POA: Diagnosis not present

## 2021-10-15 DIAGNOSIS — Z299 Encounter for prophylactic measures, unspecified: Secondary | ICD-10-CM | POA: Diagnosis not present

## 2021-10-25 ENCOUNTER — Other Ambulatory Visit: Payer: Self-pay | Admitting: Cardiology

## 2021-11-05 DIAGNOSIS — Z713 Dietary counseling and surveillance: Secondary | ICD-10-CM | POA: Diagnosis not present

## 2021-11-05 DIAGNOSIS — Z6828 Body mass index (BMI) 28.0-28.9, adult: Secondary | ICD-10-CM | POA: Diagnosis not present

## 2021-11-05 DIAGNOSIS — R21 Rash and other nonspecific skin eruption: Secondary | ICD-10-CM | POA: Diagnosis not present

## 2021-11-05 DIAGNOSIS — Z299 Encounter for prophylactic measures, unspecified: Secondary | ICD-10-CM | POA: Diagnosis not present

## 2021-11-05 DIAGNOSIS — I10 Essential (primary) hypertension: Secondary | ICD-10-CM | POA: Diagnosis not present

## 2021-11-27 DIAGNOSIS — I10 Essential (primary) hypertension: Secondary | ICD-10-CM | POA: Diagnosis not present

## 2021-11-27 DIAGNOSIS — R21 Rash and other nonspecific skin eruption: Secondary | ICD-10-CM | POA: Diagnosis not present

## 2021-11-27 DIAGNOSIS — Z299 Encounter for prophylactic measures, unspecified: Secondary | ICD-10-CM | POA: Diagnosis not present

## 2021-11-29 ENCOUNTER — Other Ambulatory Visit: Payer: Self-pay | Admitting: Cardiology

## 2021-12-16 DIAGNOSIS — L400 Psoriasis vulgaris: Secondary | ICD-10-CM | POA: Diagnosis not present

## 2021-12-18 DIAGNOSIS — H04123 Dry eye syndrome of bilateral lacrimal glands: Secondary | ICD-10-CM | POA: Diagnosis not present

## 2021-12-18 DIAGNOSIS — E119 Type 2 diabetes mellitus without complications: Secondary | ICD-10-CM | POA: Diagnosis not present

## 2021-12-18 DIAGNOSIS — H25813 Combined forms of age-related cataract, bilateral: Secondary | ICD-10-CM | POA: Diagnosis not present

## 2021-12-18 DIAGNOSIS — H43811 Vitreous degeneration, right eye: Secondary | ICD-10-CM | POA: Diagnosis not present

## 2021-12-18 DIAGNOSIS — H524 Presbyopia: Secondary | ICD-10-CM | POA: Diagnosis not present

## 2021-12-20 NOTE — Progress Notes (Signed)
Cardiology Office Note  Date: 12/22/2021   ID: LESSLIE MCKEEHAN, DOB 31-Jan-1946, MRN [id number]  PCP:  Glenda Chroman, MD  Cardiologist:  Rozann Lesches, MD Electrophysiologist:  None   Chief Complaint  Patient presents with   Cardiac follow-up    History of Present Illness: Joseph Burton is a 76 y.o. male last seen in February 2022 by Ms. Duke PA-C, our last encounter was in 2020.  He is here today with his wife for a follow-up visit.  Overall doing well, no angina symptoms and stable NYHA class II dyspnea.  He is in the process of reworking his outside deck.  He continues to follow with Dr. Woody Seller, we are requesting his most recent lab work for review.  I went over his medications and from a cardiac perspective he remains on aspirin, atenolol, and Lipitor.  I personally reviewed his ECG today which shows normal sinus rhythm with nonspecific T wave changes.  Past Medical History:  Diagnosis Date   Arthritis    Bloating 03/16/2018   Coronary artery disease    Multivessel status post CABG 2002   Early cataracts, bilateral    Environmental and seasonal allergies    Fatty liver    GERD (gastroesophageal reflux disease)    Hearing loss    Hypertension    Sleep apnea    Type 2 diabetes mellitus ([place])    Umbilical hernia     Past Surgical History:  Procedure Laterality Date   CARDIAC CATHETERIZATION     2002   CHOLECYSTECTOMY     3 years ago   COLONOSCOPY N/A 10/02/2015   Procedure: COLONOSCOPY;  Surgeon: Rogene Houston, MD;  [facility];  Service: Endoscopy;  Laterality: N/A;  930   COLONOSCOPY N/A 11/14/2020   Procedure: COLONOSCOPY;  Surgeon: Rogene Houston, MD;  [facility];  Service: Endoscopy;  Laterality: N/A;  [place]   CORONARY ARTERY BYPASS GRAFT  2002   2002   ESOPHAGOGASTRODUODENOSCOPY N/A 03/28/2018   Procedure: ESOPHAGOGASTRODUODENOSCOPY (EGD);  Surgeon: Rogene Houston, MD;  [facility];  Service: Endoscopy;   Laterality: N/A;  2:20   INSERTION OF MESH N/A 05/29/2016   Procedure: INSERTION OF MESH;  Surgeon: Ralene Ok, MD;  Location: [place];  Service: General;  Laterality: N/A;   JOINT REPLACEMENT     Left shoulder   POLYPECTOMY  11/14/2020   Procedure: POLYPECTOMY;  Surgeon: Rogene Houston, MD;  [facility];  Service: Endoscopy;;   SHOULDER ARTHROSCOPY W/ ROTATOR CUFF REPAIR Left 07/2010   TONSILLECTOMY  1969   TREATMENT FISTULA ANAL     UMBILICAL HERNIA REPAIR N/A 05/29/2016   Procedure: LAPAROSCOPIC UMBILICAL HERNIA;  Surgeon: Ralene Ok, MD;  Location: [place];  Service: General;  Laterality: N/A;    Current Outpatient Medications  Medication Sig Dispense Refill   aspirin EC 81 MG tablet Take 81 mg by mouth every other day. Swallow whole.     atenolol (TENORMIN) 25 MG tablet Take 25 mg by mouth at bedtime.     atorvastatin (LIPITOR) 20 MG tablet TAKE 1 TABLET BY MOUTH ONCE DAILY** NEEDS  APPOINTMENT  FOR  REFILLS 15 tablet 0   calcium carbonate (OS-CAL) 600 MG TABS tablet Take 600 mg by mouth 2 (two) times daily with a meal.     carboxymethylcellulose (REFRESH PLUS) 0.5 % SOLN Place 1 drop into both eyes 3 (three) times daily as needed (for dry/irritated/watery eyes.).  Cholecalciferol (VITAMIN D-3) 125 MCG (5000 UT) TABS Take 5,000 Units by mouth 3 (three) times a week.     CINNAMON PO Take 1,000 mg by mouth in the morning and at bedtime.     Cyanocobalamin (VITAMIN B-12) 2500 MCG SUBL Take 2,500 mcg by mouth in the morning.     folic acid (FOLVITE) 657 MCG tablet Take 400 mcg by mouth in the morning.     ipratropium (ATROVENT) 0.06 % nasal spray Place 2 sprays into both nostrils 3 (three) times daily as needed (NASAL CONGESTION/ALLERGIES.).      nabumetone (RELAFEN) 750 MG tablet Take 750 mg by mouth 2 (two) times daily.      pantoprazole (PROTONIX) 40 MG tablet Take 1 tablet (40 mg total) by mouth in the morning. 90 tablet 2   pyridOXINE (VITAMIN B-6) 100 MG tablet  Take 100 mg by mouth in the morning.     Red Yeast Rice Extract 600 MG CAPS Take 600 mg by mouth every evening.     simethicone (MYLICON) 846 MG chewable tablet Chew 125 mg by mouth every 6 (six) hours as needed for flatulence.     tamsulosin (FLOMAX) 0.4 MG CAPS capsule Take 0.4 mg by mouth 2 (two) times daily.   4   Zinc 50 MG TABS Take 50 mg by mouth in the morning.     No current facility-administered medications for this visit.   Allergies:  No known allergies   ROS: No palpitations or syncope.  No orthopnea or PND.  Physical Exam: VS:  BP 134/72   Pulse 67   Ht 5' 9.5" (1.765 m)   Wt 191 lb 9.6 oz (86.9 kg)   SpO2 95%   BMI 27.89 kg/m , BMI Body mass index is 27.89 kg/m.  Wt Readings from Last 3 Encounters:  12/22/21 191 lb 9.6 oz (86.9 kg)  04/28/21 201 lb (91.2 kg)  04/01/21 195 lb (88.5 kg)    General: Patient appears comfortable at rest. HEENT: Conjunctiva and lids normal, oropharynx clear. Neck: Supple, no elevated JVP or carotid bruits, no thyromegaly. Lungs: Clear to auscultation, nonlabored breathing at rest. Cardiac: Regular rate and rhythm, no S3, 1/6 systolic murmur. Abdomen: Soft, nontender, bowel sounds present. Extremities: No pitting edema, distal pulses 2+.  ECG:  An ECG dated 07/09/2020 was personally reviewed today and demonstrated:  Sinus rhythm with nonspecific ST changes.  Recent Labwork:  No interval lab work for review today.  Other Studies Reviewed Today:  Echocardiogram 04/02/2016: Study Conclusions   - Left ventricle: The cavity size was normal. Wall thickness was   increased in a pattern of mild LVH. Systolic function was   vigorous. The estimated ejection fraction was in the range of 65%   to 70%. Wall motion was normal; there were no regional wall   motion abnormalities. Features are consistent with a pseudonormal   left ventricular filling pattern, with concomitant abnormal   relaxation and increased filling pressure (grade 2  diastolic   dysfunction). - Mitral valve: There was trivial regurgitation. - Right atrium: Central venous pressure (est): 3 mm Hg. - Tricuspid valve: There was mild regurgitation. - Pulmonary arteries: PA peak pressure: 33 mm Hg (S). - Pericardium, extracardiac: There was no pericardial effusion.   Impressions:   - Mild LVH with LVEF 65-70%. Grade 2 diastolic dysfunction. Trivial   mitral regurgitation. Mild tricuspid regurgitation with PASP 33   mmHg.   Lexiscan Myoview 04/02/2016: No diagnostic ST segment changes to indicate ischemia. Small,  mild intensity, apical anterior defect that is most consistent with attenuation artifact. No large, reversible perfusion defects to indicate ischemic territories. This is a low risk study. Nuclear stress EF: 70%.  Assessment and Plan:  1.  Multivessel CAD status post CABG in 2002.  He is doing well without angina symptoms and stable NYHA class II dyspnea on medical therapy.  Last formal ischemic testing was in 2017.  ECG reviewed and stable.  Would continue with observation for now unless symptoms intervene.  Continue aspirin, atenolol, and Lipitor.  2.  Mixed hyperlipidemia on Lipitor.  Requesting interval lab work from PCP.  Medication Adjustments/Labs and Tests Ordered: Current medicines are reviewed at length with the patient today.  Concerns regarding medicines are outlined above.   Tests Ordered: No orders of the defined types were placed in this encounter.   Medication Changes: No orders of the defined types were placed in this encounter.   Disposition:  Follow up  1 year, sooner if needed.  Signed, Satira Sark, MD, [facility] 12/22/2021 11:28 AM    [facility] [place], [place], [place] [address] Phone: [phone]; Fax: [phone]

## 2021-12-22 ENCOUNTER — Ambulatory Visit: Payer: Medicare HMO | Admitting: Cardiology

## 2021-12-22 ENCOUNTER — Encounter: Payer: Self-pay | Admitting: Cardiology

## 2021-12-22 VITALS — BP 134/72 | HR 67 | Ht 69.5 in | Wt 191.6 lb

## 2021-12-22 DIAGNOSIS — E782 Mixed hyperlipidemia: Secondary | ICD-10-CM | POA: Diagnosis not present

## 2021-12-22 DIAGNOSIS — I25119 Atherosclerotic heart disease of native coronary artery with unspecified angina pectoris: Secondary | ICD-10-CM

## 2021-12-22 NOTE — Patient Instructions (Signed)
Medication Instructions:  Your physician recommends that you continue on your current medications as directed. Please refer to the Current Medication list given to you today.   Labwork: none  Testing/Procedures: none  Follow-Up:  Your physician recommends that you schedule a follow-up appointment in: 1 year   Any Other Special Instructions Will Be Listed Below (If Applicable).  You will receive a call in about 10 months reminding you to schedule your appointment. If you do not receive this call, please contact our office.  If you need a refill on your cardiac medications before your next appointment, please call your pharmacy.  

## 2021-12-25 ENCOUNTER — Telehealth: Payer: Self-pay | Admitting: Cardiology

## 2021-12-25 NOTE — Telephone Encounter (Signed)
Atorvastatin refill sent to [facility] today already Message left on vm to check with pharmacy

## 2021-12-25 NOTE — Telephone Encounter (Signed)
*  STAT* If patient is at the pharmacy, call can be transferred to refill team.   1. Which medications need to be refilled? (please list name of each medication and dose if known) Atorvastatin  2. Which pharmacy/location (including street and city if local pharmacy) is medication to be sent to? [facility] [place], [place]  3. Do they need a 30 day or 90 day supply?  90 days and refills- please call today- he is completely out

## 2021-12-26 DIAGNOSIS — H524 Presbyopia: Secondary | ICD-10-CM | POA: Diagnosis not present

## 2021-12-29 ENCOUNTER — Telehealth: Payer: Self-pay | Admitting: *Deleted

## 2021-12-29 MED ORDER — ATORVASTATIN CALCIUM 40 MG PO TABS: 40.0000 mg | ORAL_TABLET | Freq: Every day | ORAL | 3 refills | Status: DC

## 2021-12-29 NOTE — Telephone Encounter (Signed)
Patient informed and verbalized understanding of plan. PCP copied on recommendations

## 2021-12-29 NOTE — Telephone Encounter (Signed)
-----   Message from Satira Sark, MD sent at 12/24/2021 11:39 AM EDT ----- Results reviewed.  Lab work from PCP noted.  Hemoglobin A1c 7.1%, he would likely be a good candidate for either Jardiance or Farxiga - can address this with Dr. Woody Seller.  LDL cholesterol is 84 on Lipitor 20 mg daily.  Would suggest increase to 40 mg daily for better control in light of vascular disease.

## 2022-01-14 DIAGNOSIS — L400 Psoriasis vulgaris: Secondary | ICD-10-CM | POA: Diagnosis not present

## 2022-01-14 DIAGNOSIS — T63441A Toxic effect of venom of bees, accidental (unintentional), initial encounter: Secondary | ICD-10-CM | POA: Diagnosis not present

## 2022-03-02 ENCOUNTER — Other Ambulatory Visit (INDEPENDENT_AMBULATORY_CARE_PROVIDER_SITE_OTHER): Payer: Self-pay | Admitting: Internal Medicine

## 2022-03-02 DIAGNOSIS — K219 Gastro-esophageal reflux disease without esophagitis: Secondary | ICD-10-CM

## 2022-03-16 DIAGNOSIS — L400 Psoriasis vulgaris: Secondary | ICD-10-CM | POA: Diagnosis not present

## 2022-05-13 DIAGNOSIS — J310 Chronic rhinitis: Secondary | ICD-10-CM | POA: Diagnosis not present

## 2022-05-13 DIAGNOSIS — H6120 Impacted cerumen, unspecified ear: Secondary | ICD-10-CM | POA: Diagnosis not present

## 2022-06-25 ENCOUNTER — Ambulatory Visit (INDEPENDENT_AMBULATORY_CARE_PROVIDER_SITE_OTHER): Payer: Medicare HMO | Admitting: Family Medicine

## 2022-06-25 VITALS — BP 137/74 | HR 74 | Temp 97.7°F | Ht 69.5 in | Wt 192.0 lb

## 2022-06-25 DIAGNOSIS — E782 Mixed hyperlipidemia: Secondary | ICD-10-CM | POA: Diagnosis not present

## 2022-06-25 DIAGNOSIS — Z130 Encounter for screening for diseases of the blood and blood-forming organs and certain disorders involving the immune mechanism: Secondary | ICD-10-CM | POA: Diagnosis not present

## 2022-06-25 DIAGNOSIS — E119 Type 2 diabetes mellitus without complications: Secondary | ICD-10-CM

## 2022-06-25 DIAGNOSIS — I25119 Atherosclerotic heart disease of native coronary artery with unspecified angina pectoris: Secondary | ICD-10-CM

## 2022-06-25 DIAGNOSIS — I10 Essential (primary) hypertension: Secondary | ICD-10-CM

## 2022-06-25 MED ORDER — ATENOLOL 25 MG PO TABS: 25.0000 mg | ORAL_TABLET | Freq: Every day | ORAL | 3 refills | Status: DC

## 2022-06-25 MED ORDER — ATORVASTATIN CALCIUM 40 MG PO TABS: 40.0000 mg | ORAL_TABLET | Freq: Every day | ORAL | 3 refills | Status: DC

## 2022-06-25 NOTE — Assessment & Plan Note (Signed)
Well-controlled on atenolol.  Continue.

## 2022-06-25 NOTE — Assessment & Plan Note (Signed)
Unsure of control.  A1c today.

## 2022-06-25 NOTE — Assessment & Plan Note (Signed)
Unsure of control.  Lipid panel today.  Continue atorvastatin.

## 2022-06-25 NOTE — Assessment & Plan Note (Signed)
Stable.  Continue current medications: Atenolol, atorvastatin, and aspirin.

## 2022-06-25 NOTE — Patient Instructions (Signed)
You're doing well.  Continue your medications.  Continue to stay active.  Labs today.  Follow up in 6 months.

## 2022-06-25 NOTE — Progress Notes (Signed)
Subjective:  Patient ID: Joseph Burton, male    DOB: 1946/01/03  Age: 77 y.o. MRN: [id number]  CC: Establish care  HPI Joseph Burton is a 77 y.o. male presents to the clinic today to establish care.  Patient has a history of coronary disease.  Followed by cardiology.  No chest pain or shortness of breath at this time.  He is active.  Unsure the status of his lipids.  He is on atorvastatin.  He is tolerating.  Needs labs.  Blood pressure is well-controlled on atenolol.  Patient states that he is not on any pharmacotherapy regarding his diabetes.  Unsure of last A1c.  Needs labs.  PMH, Surgical Hx, Family Hx, Social History reviewed.  Past Medical History:  Diagnosis Date   Arthritis    Bloating 03/16/2018   Coronary artery disease    Multivessel status post CABG 2002   Early cataracts, bilateral    Environmental and seasonal allergies    Fatty liver    GERD (gastroesophageal reflux disease)    Hearing loss    Hypertension    Sleep apnea    Type 2 diabetes mellitus ([place])    Umbilical hernia    Past Surgical History:  Procedure Laterality Date   CARDIAC CATHETERIZATION     2002   CHOLECYSTECTOMY     3 years ago   COLONOSCOPY N/A 10/02/2015   Procedure: COLONOSCOPY;  Surgeon: Rogene Houston, MD;  [facility];  Service: Endoscopy;  Laterality: N/A;  930   COLONOSCOPY N/A 11/14/2020   Procedure: COLONOSCOPY;  Surgeon: Rogene Houston, MD;  [facility];  Service: Endoscopy;  Laterality: N/A;  [place]   CORONARY ARTERY BYPASS GRAFT  2002   2002   ESOPHAGOGASTRODUODENOSCOPY N/A 03/28/2018   Procedure: ESOPHAGOGASTRODUODENOSCOPY (EGD);  Surgeon: Rogene Houston, MD;  [facility];  Service: Endoscopy;  Laterality: N/A;  2:20   INSERTION OF MESH N/A 05/29/2016   Procedure: INSERTION OF MESH;  Surgeon: Ralene Ok, MD;  Location: [place];  Service: General;  Laterality: N/A;   JOINT REPLACEMENT     Left shoulder   POLYPECTOMY   11/14/2020   Procedure: POLYPECTOMY;  Surgeon: Rogene Houston, MD;  [facility];  Service: Endoscopy;;   SHOULDER ARTHROSCOPY W/ ROTATOR CUFF REPAIR Left 07/2010   TONSILLECTOMY  1969   TREATMENT FISTULA ANAL     UMBILICAL HERNIA REPAIR N/A 05/29/2016   Procedure: LAPAROSCOPIC UMBILICAL HERNIA;  Surgeon: Ralene Ok, MD;  Location: [place];  Service: General;  Laterality: N/A;   Family History  Problem Relation Age of Onset   Alzheimer's disease Mother    Colon cancer Father    Heart disease Father    Hypertension Father    Melanoma Father    Prostate cancer Father    Heart disease Sister    Heart disease Brother    Social History   Tobacco Use   Smoking status: Never   Smokeless tobacco: Never  Substance Use Topics   Alcohol use: No    Review of Systems  Constitutional: Negative.   Respiratory: Negative.    Cardiovascular: Negative.   Musculoskeletal:  Positive for neck pain.    Objective:   Today's Vitals: BP 137/74   Pulse 74   Temp 97.7 F (36.5 C)   Ht 5' 9.5" (1.765 m)   Wt 192 lb (87.1 kg)   SpO2 96%   BMI 27.95 kg/m   Physical Exam Vitals and  nursing note reviewed.  Constitutional:      General: He is not in acute distress.    Appearance: Normal appearance.  HENT:     Head: Normocephalic and atraumatic.  Eyes:     General:        Right eye: No discharge.        Left eye: No discharge.     Conjunctiva/sclera: Conjunctivae normal.  Cardiovascular:     Rate and Rhythm: Normal rate and regular rhythm.  Pulmonary:     Effort: Pulmonary effort is normal.     Breath sounds: Normal breath sounds. No wheezing, rhonchi or rales.  Neurological:     Mental Status: He is alert.  Psychiatric:        Mood and Affect: Mood normal.        Behavior: Behavior normal.      Assessment & Plan:   Problem List Items Addressed This Visit       Cardiovascular and Mediastinum   Primary hypertension    Well-controlled on atenolol.  Continue.       Relevant Medications   atenolol (TENORMIN) 25 MG tablet   atorvastatin (LIPITOR) 40 MG tablet   Coronary artery disease involving native coronary artery of native heart with angina pectoris (HCC)    Stable.  Continue current medications: Atenolol, atorvastatin, and aspirin.      Relevant Medications   atenolol (TENORMIN) 25 MG tablet   atorvastatin (LIPITOR) 40 MG tablet     Endocrine   Type 2 diabetes mellitus without complication, without long-term current use of insulin (HCC) - Primary    Unsure of control.  A1c today.      Relevant Medications   atorvastatin (LIPITOR) 40 MG tablet   Other Relevant Orders   CMP14+EGFR   Hemoglobin A1c   Microalbumin / creatinine urine ratio     Other   Mixed hyperlipidemia    Unsure of control.  Lipid panel today.  Continue atorvastatin.      Relevant Medications   atenolol (TENORMIN) 25 MG tablet   atorvastatin (LIPITOR) 40 MG tablet   Other Relevant Orders   Lipid panel   Other Visit Diagnoses     Screening for deficiency anemia       Relevant Orders   CBC      Meds ordered this encounter  Medications   atenolol (TENORMIN) 25 MG tablet    Sig: Take 1 tablet (25 mg total) by mouth at bedtime.    Dispense:  90 tablet    Refill:  3   atorvastatin (LIPITOR) 40 MG tablet    Sig: Take 1 tablet (40 mg total) by mouth daily.    Dispense:  90 tablet    Refill:  3     Follow-up: Return in about 6 months (around 12/24/2022).  [place]

## 2022-06-27 DIAGNOSIS — R059 Cough, unspecified: Secondary | ICD-10-CM | POA: Diagnosis not present

## 2022-06-27 DIAGNOSIS — U071 COVID-19: Secondary | ICD-10-CM | POA: Diagnosis not present

## 2022-06-27 DIAGNOSIS — Z20822 Contact with and (suspected) exposure to covid-19: Secondary | ICD-10-CM | POA: Diagnosis not present

## 2022-06-27 LAB — CMP14+EGFR
ALT: 38 IU/L (ref 0–44)
AST: 27 IU/L (ref 0–40)
Albumin/Globulin Ratio: 1.8 (ref 1.2–2.2)
Albumin: 4.6 g/dL (ref 3.8–4.8)
Alkaline Phosphatase: 106 IU/L (ref 44–121)
BUN/Creatinine Ratio: 13 (ref 10–24)
BUN: 15 mg/dL (ref 8–27)
Bilirubin Total: 0.7 mg/dL (ref 0.0–1.2)
CO2: 20 mmol/L (ref 20–29)
Calcium: 10.2 mg/dL (ref 8.6–10.2)
Chloride: 100 mmol/L (ref 96–106)
Creatinine, Ser: 1.12 mg/dL (ref 0.76–1.27)
Globulin, Total: 2.6 g/dL (ref 1.5–4.5)
Glucose: 252 mg/dL — ABNORMAL HIGH (ref 70–99)
Potassium: 4.4 mmol/L (ref 3.5–5.2)
Sodium: 139 mmol/L (ref 134–144)
Total Protein: 7.2 g/dL (ref 6.0–8.5)
eGFR: 68 mL/min/{1.73_m2} (ref >59)

## 2022-06-27 LAB — CBC
Hematocrit: 43.7 % (ref 37.5–51.0)
Hemoglobin: 15.4 g/dL (ref 13.0–17.7)
MCH: 32.6 pg (ref 26.6–33.0)
MCHC: 35.2 g/dL (ref 31.5–35.7)
MCV: 93 fL (ref 79–97)
Platelets: 152 10*3/uL (ref 150–450)
RBC: 4.72 x10E6/uL (ref 4.14–5.80)
RDW: 12.3 % (ref 11.6–15.4)
WBC: 7.7 10*3/uL (ref 3.4–10.8)

## 2022-06-27 LAB — LIPID PANEL
Chol/HDL Ratio: 3.4 ratio (ref 0.0–5.0)
Cholesterol, Total: 132 mg/dL (ref 100–199)
HDL: 39 mg/dL — ABNORMAL LOW (ref >39)
LDL Chol Calc (NIH): 66 mg/dL (ref 0–99)
Triglycerides: 158 mg/dL — ABNORMAL HIGH (ref 0–149)
VLDL Cholesterol Cal: 27 mg/dL (ref 5–40)

## 2022-06-27 LAB — MICROALBUMIN / CREATININE URINE RATIO
Creatinine, Urine: 163.1 mg/dL
Microalb/Creat Ratio: 44 mg/g creat — ABNORMAL HIGH (ref 0–29)
Microalbumin, Urine: 71.7 ug/mL

## 2022-06-27 LAB — HEMOGLOBIN A1C
Est. average glucose Bld gHb Est-mCnc: 174 mg/dL
Hgb A1c MFr Bld: 7.7 % — ABNORMAL HIGH (ref 4.8–5.6)

## 2022-06-29 ENCOUNTER — Other Ambulatory Visit: Payer: Self-pay | Admitting: Family Medicine

## 2022-06-29 MED ORDER — EMPAGLIFLOZIN 10 MG PO TABS: 10.0000 mg | ORAL_TABLET | Freq: Every day | ORAL | 1 refills | Status: DC

## 2022-07-22 ENCOUNTER — Other Ambulatory Visit: Payer: Self-pay | Admitting: Family Medicine

## 2022-07-22 MED ORDER — METFORMIN HCL 500 MG PO TABS: 500.0000 mg | ORAL_TABLET | Freq: Two times a day (BID) | ORAL | 3 refills | Status: DC

## 2022-09-11 ENCOUNTER — Ambulatory Visit (INDEPENDENT_AMBULATORY_CARE_PROVIDER_SITE_OTHER): Payer: Medicare HMO

## 2022-09-11 ENCOUNTER — Telehealth: Payer: Self-pay

## 2022-09-11 VITALS — BP 128/72 | Ht 69.5 in | Wt 193.8 lb

## 2022-09-11 DIAGNOSIS — Z0000 Encounter for general adult medical examination without abnormal findings: Secondary | ICD-10-CM

## 2022-09-11 NOTE — Patient Instructions (Addendum)
Joseph Burton , Thank you for taking time to come for your Medicare Wellness Visit. I appreciate your ongoing commitment to your health goals. Please review the following plan we discussed and let me know if I can assist you in the future.   These are the goals we discussed:  Goals      Remain active and independent        This is a list of the screening recommended for you and due dates:  Health Maintenance  Topic Date Due   Complete foot exam   Never done   Hepatitis C Screening: USPSTF Recommendation to screen - Ages 75-79 yo.  Never done   COVID-19 Vaccine (2 - Moderna risk series) 02/14/2019   Zoster (Shingles) Vaccine (1 of 2) 09/23/2022*   Pneumonia Vaccine (2 of 2 - PCV) 06/26/2023*   Flu Shot  12/17/2022   Hemoglobin A1C  12/24/2022   Eye exam for diabetics  02/17/2023   Yearly kidney function blood test for diabetes  06/26/2023   Yearly kidney health urinalysis for diabetes  06/26/2023   Medicare Annual Wellness Visit  09/11/2023   DTaP/Tdap/Td vaccine (2 - Td or Tdap) 02/25/2027   HPV Vaccine  Aged Out   Colon Cancer Screening  Discontinued  *Topic was postponed. The date shown is not the original due date.    Advanced directives: Advance directive discussed with you today. I have provided a copy for you to complete at home and have notarized. Once this is complete please bring a copy in to our office so we can scan it into your chart.   Conditions/risks identified: Aim for 30 minutes of exercise or brisk walking, 6-8 glasses of water, and 5 servings of fruits and vegetables each day.   Next appointment: Follow up in one year for your annual wellness visit.   Preventive Care 43 Years and Older, Male  Preventive care refers to lifestyle choices and visits with your health care provider that can promote health and wellness. What does preventive care include? A yearly physical exam. This is also called an annual well check. Dental exams once or twice a year. Routine  eye exams. Ask your health care provider how often you should have your eyes checked. Personal lifestyle choices, including: Daily care of your teeth and gums. Regular physical activity. Eating a healthy diet. Avoiding tobacco and drug use. Limiting alcohol use. Practicing safe sex. Taking low doses of aspirin every day. Taking vitamin and mineral supplements as recommended by your health care provider. What happens during an annual well check? The services and screenings done by your health care provider during your annual well check will depend on your age, overall health, lifestyle risk factors, and family history of disease. Counseling  Your health care provider may ask you questions about your: Alcohol use. Tobacco use. Drug use. Emotional well-being. Home and relationship well-being. Sexual activity. Eating habits. History of falls. Memory and ability to understand (cognition). Work and work Astronomer. Screening  You may have the following tests or measurements: Height, weight, and BMI. Blood pressure. Lipid and cholesterol levels. These may be checked every 5 years, or more frequently if you are over 24 years old. Skin check. Lung cancer screening. You may have this screening every year starting at age 36 if you have a 30-pack-year history of smoking and currently smoke or have quit within the past 15 years. Fecal occult blood test (FOBT) of the stool. You may have this test every year starting at age  50. Flexible sigmoidoscopy or colonoscopy. You may have a sigmoidoscopy every 5 years or a colonoscopy every 10 years starting at age 89. Prostate cancer screening. Recommendations will vary depending on your family history and other risks. Hepatitis C blood test. Hepatitis B blood test. Sexually transmitted disease (STD) testing. Diabetes screening. This is done by checking your blood sugar (glucose) after you have not eaten for a while (fasting). You may have this done  every 1-3 years. Abdominal aortic aneurysm (AAA) screening. You may need this if you are a current or former smoker. Osteoporosis. You may be screened starting at age 15 if you are at high risk. Talk with your health care provider about your test results, treatment options, and if necessary, the need for more tests. Vaccines  Your health care provider may recommend certain vaccines, such as: Influenza vaccine. This is recommended every year. Tetanus, diphtheria, and acellular pertussis (Tdap, Td) vaccine. You may need a Td booster every 10 years. Zoster vaccine. You may need this after age 73. Pneumococcal 13-valent conjugate (PCV13) vaccine. One dose is recommended after age 75. Pneumococcal polysaccharide (PPSV23) vaccine. One dose is recommended after age 36. Talk to your health care provider about which screenings and vaccines you need and how often you need them. This information is not intended to replace advice given to you by your health care provider. Make sure you discuss any questions you have with your health care provider. Document Released: 05/31/2015 Document Revised: 01/22/2016 Document Reviewed: 03/05/2015 Elsevier Interactive Patient Education  2017 [employer].  Fall Prevention in the Home Falls can cause injuries. They can happen to people of all ages. There are many things you can do to make your home safe and to help prevent falls. What can I do on the outside of my home? Regularly fix the edges of walkways and driveways and fix any cracks. Remove anything that might make you trip as you walk through a door, such as a raised step or threshold. Trim any bushes or trees on the path to your home. Use bright outdoor lighting. Clear any walking paths of anything that might make someone trip, such as rocks or tools. Regularly check to see if handrails are loose or broken. Make sure that both sides of any steps have handrails. Any raised decks and porches should have  guardrails on the edges. Have any leaves, snow, or ice cleared regularly. Use sand or salt on walking paths during winter. Clean up any spills in your garage right away. This includes oil or grease spills. What can I do in the bathroom? Use night lights. Install grab bars by the toilet and in the tub and shower. Do not use towel bars as grab bars. Use non-skid mats or decals in the tub or shower. If you need to sit down in the shower, use a plastic, non-slip stool. Keep the floor dry. Clean up any water that spills on the floor as soon as it happens. Remove soap buildup in the tub or shower regularly. Attach bath mats securely with double-sided non-slip rug tape. Do not have throw rugs and other things on the floor that can make you trip. What can I do in the bedroom? Use night lights. Make sure that you have a light by your bed that is easy to reach. Do not use any sheets or blankets that are too big for your bed. They should not hang down onto the floor. Have a firm chair that has side arms. You can use  this for support while you get dressed. Do not have throw rugs and other things on the floor that can make you trip. What can I do in the kitchen? Clean up any spills right away. Avoid walking on wet floors. Keep items that you use a lot in easy-to-reach places. If you need to reach something above you, use a strong step stool that has a grab bar. Keep electrical cords out of the way. Do not use floor polish or wax that makes floors slippery. If you must use wax, use non-skid floor wax. Do not have throw rugs and other things on the floor that can make you trip. What can I do with my stairs? Do not leave any items on the stairs. Make sure that there are handrails on both sides of the stairs and use them. Fix handrails that are broken or loose. Make sure that handrails are as long as the stairways. Check any carpeting to make sure that it is firmly attached to the stairs. Fix any carpet  that is loose or worn. Avoid having throw rugs at the top or bottom of the stairs. If you do have throw rugs, attach them to the floor with carpet tape. Make sure that you have a light switch at the top of the stairs and the bottom of the stairs. If you do not have them, ask someone to add them for you. What else can I do to help prevent falls? Wear shoes that: Do not have high heels. Have rubber bottoms. Are comfortable and fit you well. Are closed at the toe. Do not wear sandals. If you use a stepladder: Make sure that it is fully opened. Do not climb a closed stepladder. Make sure that both sides of the stepladder are locked into place. Ask someone to hold it for you, if possible. Clearly mark and make sure that you can see: Any grab bars or handrails. First and last steps. Where the edge of each step is. Use tools that help you move around (mobility aids) if they are needed. These include: Canes. Walkers. Scooters. Crutches. Turn on the lights when you go into a dark area. Replace any light bulbs as soon as they burn out. Set up your furniture so you have a clear path. Avoid moving your furniture around. If any of your floors are uneven, fix them. If there are any pets around you, be aware of where they are. Review your medicines with your doctor. Some medicines can make you feel dizzy. This can increase your chance of falling. Ask your doctor what other things that you can do to help prevent falls. This information is not intended to replace advice given to you by your health care provider. Make sure you discuss any questions you have with your health care provider. Document Released: 02/28/2009 Document Revised: 10/10/2015 Document Reviewed: 06/08/2014 Elsevier Interactive Patient Education  2017 [employer].

## 2022-09-11 NOTE — Progress Notes (Signed)
Subjective:   Joseph Burton is a 77 y.o. male who presents for an Initial Medicare Annual Wellness Visit.  Review of Systems     Cardiac Risk Factors include: advanced age (>7men, >68 women);hypertension;dyslipidemia;male gender     Objective:    Today's Vitals   09/11/22 1108  BP: 128/72  Weight: 193 lb 12.8 oz (87.9 kg)  Height: 5' 9.5" (1.765 m)   Body mass index is 28.21 kg/m.     09/11/2022   11:37 AM 11/14/2020    9:20 AM 03/28/2018   12:06 PM 05/29/2016    9:11 AM 05/25/2016   11:18 AM 03/05/2016   10:23 AM 10/02/2015    8:44 AM  Advanced Directives  Does Patient Have a Medical Advance Directive? No No No No No No No  Would patient like information on creating a medical advance directive? Yes (MAU/Ambulatory/Procedural Areas - Information given) No - Patient declined No - Patient declined  Yes (MAU/Ambulatory/Procedural Areas - Information given) No - patient declined information No - patient declined information    Current Medications (verified) Outpatient Encounter Medications as of 09/11/2022  Medication Sig   aspirin EC 81 MG tablet Take 81 mg by mouth every other day. Swallow whole.   atenolol (TENORMIN) 25 MG tablet Take 1 tablet (25 mg total) by mouth at bedtime.   atorvastatin (LIPITOR) 40 MG tablet Take 1 tablet (40 mg total) by mouth daily.   calcium carbonate (OS-CAL) 600 MG TABS tablet Take 600 mg by mouth 2 (two) times daily with a meal.   carboxymethylcellulose (REFRESH PLUS) 0.5 % SOLN Place 1 drop into both eyes 3 (three) times daily as needed (for dry/irritated/watery eyes.).   CINNAMON PO Take 1,000 mg by mouth in the morning and at bedtime.   clobetasol cream (TEMOVATE) 0.05 % Apply 1 Application topically 2 (two) times daily.   Cyanocobalamin (VITAMIN B-12) 2500 MCG SUBL Take 2,500 mcg by mouth in the morning.   Flaxseed, Linseed, (FLAX SEED OIL) 1000 MG CAPS Take by mouth.   folic acid (FOLVITE) 400 MCG tablet Take 400 mcg by mouth in the  morning.   ipratropium (ATROVENT) 0.06 % nasal spray Place 2 sprays into both nostrils 3 (three) times daily as needed (NASAL CONGESTION/ALLERGIES.).    metFORMIN (GLUCOPHAGE) 500 MG tablet Take 1 tablet (500 mg total) by mouth 2 (two) times daily with a meal.   nabumetone (RELAFEN) 750 MG tablet Take 750 mg by mouth 2 (two) times daily.    Potassium 99 MG TABS Take by mouth.   pyridOXINE (VITAMIN B-6) 100 MG tablet Take 100 mg by mouth in the morning.   Red Yeast Rice Extract 600 MG CAPS Take 600 mg by mouth every evening.   tamsulosin (FLOMAX) 0.4 MG CAPS capsule Take 0.4 mg by mouth daily.   Zinc 50 MG TABS Take 50 mg by mouth in the morning.   No facility-administered encounter medications on file as of 09/11/2022.    Allergies (verified) No known allergies   History: Past Medical History:  Diagnosis Date   Arthritis    Bloating 03/16/2018   Coronary artery disease    Multivessel status post CABG 2002   Early cataracts, bilateral    Environmental and seasonal allergies    Fatty liver    GERD (gastroesophageal reflux disease)    Hearing loss    Hypertension    Sleep apnea    Type 2 diabetes mellitus (HCC)    Umbilical hernia    Past  Surgical History:  Procedure Laterality Date   CARDIAC CATHETERIZATION     2002   CHOLECYSTECTOMY     3 years ago   COLONOSCOPY N/A 10/02/2015   Procedure: COLONOSCOPY;  Surgeon: Malissa Hippo, MD;  [facility];  Service: Endoscopy;  Laterality: N/A;  930   COLONOSCOPY N/A 11/14/2020   Procedure: COLONOSCOPY;  Surgeon: Malissa Hippo, MD;  [facility]: [facility];  Service: Endoscopy;  Laterality: N/A;  1030   CORONARY ARTERY BYPASS GRAFT  2002   2002   ESOPHAGOGASTRODUODENOSCOPY N/A 03/28/2018   Procedure: ESOPHAGOGASTRODUODENOSCOPY (EGD);  Surgeon: Malissa Hippo, MD;  [facility]: [facility];  Service: Endoscopy;  Laterality: N/A;  2:20   INSERTION OF MESH N/A 05/29/2016   Procedure: INSERTION OF MESH;  Surgeon: Axel Filler, MD;  Location: [facility];  Service: General;  Laterality: N/A;   JOINT REPLACEMENT     Left shoulder   POLYPECTOMY  11/14/2020   Procedure: POLYPECTOMY;  Surgeon: Malissa Hippo, MD;  [facility]: [facility];  Service: Endoscopy;;   SHOULDER ARTHROSCOPY W/ ROTATOR CUFF REPAIR Left 07/2010   TONSILLECTOMY  1969   TREATMENT FISTULA ANAL     UMBILICAL HERNIA REPAIR N/A 05/29/2016   Procedure: LAPAROSCOPIC UMBILICAL HERNIA;  Surgeon: Axel Filler, MD;  Location: [facility];  Service: General;  Laterality: N/A;   Family History  Problem Relation Age of Onset   Alzheimer's disease Mother    Colon cancer Father    Heart disease Father    Hypertension Father    Melanoma Father    Prostate cancer Father    Heart disease Sister    Heart disease Brother    Social History   Socioeconomic History   Marital status: Married    Spouse name: Not on file   Number of children: Not on file   Years of education: Not on file   Highest education level: Not on file  Occupational History   Occupation: retired    Associate Professor: [employer]  Tobacco Use   Smoking status: Never   Smokeless tobacco: Never  Vaping Use   Vaping Use: Never used  Substance and Sexual Activity   Alcohol use: No   Drug use: No   Sexual activity: Not on file  Other Topics Concern   Not on file  Social History Narrative   Not on file   Social Determinants of Health   Financial Resource Strain: Low Risk  (09/11/2022)   Overall Financial Resource Strain (CARDIA)    Difficulty of Paying Living Expenses: Not hard at all  Food Insecurity: No Food Insecurity (09/11/2022)   Hunger Vital Sign    Worried About Running Out of Food in the Last Year: Never true    Ran Out of Food in the Last Year: Never true  Transportation Needs: No Transportation Needs (09/11/2022)   PRAPARE - Administrator, Civil Service (Medical): No    Lack of Transportation (Non-Medical): No  Physical Activity: Sufficiently Active (09/11/2022)    Exercise Vital Sign    Days of Exercise per Week: 5 days    Minutes of Exercise per Session: 30 min  Stress: No Stress Concern Present (09/11/2022)   [employer] of Occupational Health - Occupational Stress Questionnaire    Feeling of Stress : Not at all  Social Connections: Socially Integrated (09/11/2022)   Social Connection and Isolation Panel [NHANES]    Frequency of Communication with Friends and Family: More than three times a week  Frequency of Social Gatherings with Friends and Family: More than three times a week    Attends Religious Services: More than 4 times per year    Active Member of [employer] or Organizations: Yes    Attends Engineer, structural: More than 4 times per year    Marital Status: Married    Tobacco Counseling Counseling given: Not Answered   Clinical Intake:  Pre-visit preparation completed: Yes  Pain : No/denies pain  Diabetes: No  How often do you need to have someone help you when you read instructions, pamphlets, or other written materials from your doctor or pharmacy?: 1 - Never  Diabetic?Yes   Nutrition Risk Assessment:  Has the patient had any N/V/D within the last 2 months?  No  Does the patient have any non-healing wounds?  No  Has the patient had any unintentional weight loss or weight gain?  No   Diabetes:  Is the patient diabetic?  Yes  If diabetic, was a CBG obtained today?  No  Did the patient bring in their glucometer from home?  No  How often do you monitor your CBG's? As needed .   Financial Strains and Diabetes Management:  Are you having any financial strains with the device, your supplies or your medication? No .  Does the patient want to be seen by Chronic Care Management for management of their diabetes?  No  Would the patient like to be referred to a Nutritionist or for Diabetic Management?  No   Diabetic Exams:  Diabetic Eye Exam: Completed 02/16/22 (records requested) Diabetic Foot Exam: Overdue, Pt  has been advised about the importance in completing this exam. Pt is scheduled for diabetic foot exam on at next office visit .   Interpreter Needed?: No  Information entered by :: Kandis Fantasia LPN   Activities of Daily Living    09/11/2022   11:37 AM  In your present state of health, do you have any difficulty performing the following activities:  Hearing? 0  Vision? 0  Difficulty concentrating or making decisions? 0  Walking or climbing stairs? 0  Dressing or bathing? 0  Doing errands, shopping? 0  Preparing Food and eating ? N  Using the Toilet? N  In the past six months, have you accidently leaked urine? N  Do you have problems with loss of bowel control? N  Managing your Medications? N  Managing your Finances? N  Housekeeping or managing your Housekeeping? N    Patient Care Team: Tommie Sams, DO as PCP - General (Family Medicine) Jonelle Sidle, MD as PCP - Cardiology (Cardiology) Caro Laroche, MD as Referring Physician (Dermatology) [facility]  Indicate any recent Medical Services you may have received from other than [facility] providers in the past year (date may be approximate).     Assessment:   This is a routine wellness examination for [place].  Hearing/Vision screen Hearing Screening - Comments:: Denies hearing difficulties   Vision Screening - Comments:: Wears rx glasses - up to date with routine eye exams with Dr. Elmer Picker    Dietary issues and exercise activities discussed: Current Exercise Habits: Home exercise routine, Type of exercise: walking, Time (Minutes): 30, Frequency (Times/Week): 5, Weekly Exercise (Minutes/Week): 150, Intensity: Mild   Goals Addressed             This Visit's Progress    Remain active and independent        Depression Screen    09/11/2022   11:46  AM 06/25/2022    9:13 AM  PHQ 2/9 Scores  PHQ - 2 Score 0 0  PHQ- 9 Score  0    Fall Risk    09/11/2022   11:46 AM 06/25/2022    9:13 AM  Fall  Risk   Falls in the past year? 0 0  Number falls in past yr: 0 0  Injury with Fall? 0 0  Risk for fall due to : No Fall Risks No Fall Risks  Follow up Falls prevention discussed;Education provided;Falls evaluation completed Falls evaluation completed    FALL RISK PREVENTION PERTAINING TO THE HOME:  Any stairs in or around the home? No  If so, are there any without handrails? No  Home free of loose throw rugs in walkways, pet beds, electrical cords, etc? Yes  Adequate lighting in your home to reduce risk of falls? Yes   ASSISTIVE DEVICES UTILIZED TO PREVENT FALLS:  Life alert? No  Use of a cane, walker or w/c? No  Grab bars in the bathroom? Yes  Shower chair or bench in shower? No  Elevated toilet seat or a handicapped toilet? Yes   TIMED UP AND GO:  Was the test performed? Yes .  Length of time to ambulate 10 feet: 6 sec.   Gait steady and fast without use of assistive device  Cognitive Function:        09/11/2022   11:37 AM  6CIT Screen  What Year? 0 points  What month? 0 points  What time? 0 points  Count back from 20 0 points  Months in reverse 0 points  Repeat phrase 0 points  Total Score 0 points    Immunizations Immunization History  Administered Date(s) Administered   Moderna Sars-Covid-2 Vaccination 01/17/2019   Pneumococcal Polysaccharide-23 02/14/2014   Tdap 02/24/2017    TDAP status: Up to date  Flu Vaccine status: Up to date  Pneumococcal vaccine status: Due, Education has been provided regarding the importance of this vaccine. Advised may receive this vaccine at local pharmacy or Health Dept. Aware to provide a copy of the vaccination record if obtained from local pharmacy or Health Dept. Verbalized acceptance and understanding.  Covid-19 vaccine status: Information provided on how to obtain vaccines.   Qualifies for Shingles Vaccine? Yes   Zostavax completed No   Shingrix Completed?: No.    Education has been provided regarding the  importance of this vaccine. Patient has been advised to call insurance company to determine out of pocket expense if they have not yet received this vaccine. Advised may also receive vaccine at local pharmacy or Health Dept. Verbalized acceptance and understanding.  Screening Tests Health Maintenance  Topic Date Due   FOOT EXAM  Never done   Hepatitis C Screening  Never done   COVID-19 Vaccine (2 - Moderna risk series) 02/14/2019   Zoster Vaccines- Shingrix (1 of 2) 09/23/2022 (Originally 10/21/1964)   Pneumonia Vaccine 56+ Years old (2 of 2 - PCV) 06/26/2023 (Originally 02/15/2015)   INFLUENZA VACCINE  12/17/2022   HEMOGLOBIN A1C  12/24/2022   OPHTHALMOLOGY EXAM  02/17/2023   Diabetic kidney evaluation - eGFR measurement  06/26/2023   Diabetic kidney evaluation - Urine ACR  06/26/2023   Medicare Annual Wellness (AWV)  09/11/2023   DTaP/Tdap/Td (2 - Td or Tdap) 02/25/2027   HPV VACCINES  Aged Out   COLONOSCOPY (Pts 45-78yrs Insurance coverage will need to be confirmed)  Discontinued    Health Maintenance  Health Maintenance Due  Topic Date Due  FOOT EXAM  Never done   Hepatitis C Screening  Never done   COVID-19 Vaccine (2 - Moderna risk series) 02/14/2019    Colorectal cancer screening: Type of screening: Colonoscopy. Completed 11/14/20. Repeat every 5 years (wants to see if he needs to repeat due to family history when due again)  Lung Cancer Screening: (Low Dose CT Chest recommended if Age 41-80 years, 30 pack-year currently smoking OR have quit w/in 15years.) does not qualify.   Lung Cancer Screening Referral: n/a  Additional Screening:  Hepatitis C Screening: does qualify; declines today  Vision Screening: Recommended annual ophthalmology exams for early detection of glaucoma and other disorders of the eye. Is the patient up to date with their annual eye exam?  Yes  Who is the provider or what is the name of the office in which the patient attends annual eye exams? Dr.  Elmer Picker  If pt is not established with a provider, would they like to be referred to a provider to establish care? No .   Dental Screening: Recommended annual dental exams for proper oral hygiene  Community Resource Referral / Chronic Care Management: CRR required this visit?  No   CCM required this visit?  No      Plan:     I have personally reviewed and noted the following in the patient's chart:   Medical and social history Use of alcohol, tobacco or illicit drugs  Current medications and supplements including opioid prescriptions. Patient is not currently taking opioid prescriptions. Functional ability and status Nutritional status Physical activity Advanced directives List of other physicians Hospitalizations, surgeries, and ER visits in previous 12 months Vitals Screenings to include cognitive, depression, and falls Referrals and appointments  In addition, I have reviewed and discussed with patient certain preventive protocols, quality metrics, and best practice recommendations. A written personalized care plan for preventive services as well as general preventive health recommendations were provided to patient.     Kandis Fantasia [place], [place]   1/61/0960   Nurse Notes: See telephone note with patient concerns

## 2022-09-11 NOTE — Telephone Encounter (Signed)
Patient seen for AWV and states that he saw rheumatology in 2022 while under care of previous pcp for pain in his right hand and joints.  States that he wasn't prescribed any type of medications to help with this but problem persists.  Would rather not return to see rheumatology.  Please advise.

## 2022-09-14 NOTE — Telephone Encounter (Signed)
Cook, Jayce G, DO     Needs appt   

## 2022-10-06 ENCOUNTER — Ambulatory Visit: Payer: Medicare HMO | Admitting: Family Medicine

## 2022-10-08 ENCOUNTER — Ambulatory Visit (INDEPENDENT_AMBULATORY_CARE_PROVIDER_SITE_OTHER): Payer: Medicare HMO | Admitting: Family Medicine

## 2022-10-08 VITALS — BP 152/77 | HR 71 | Temp 97.9°F | Wt 192.0 lb

## 2022-10-08 DIAGNOSIS — M19041 Primary osteoarthritis, right hand: Secondary | ICD-10-CM

## 2022-10-08 DIAGNOSIS — E119 Type 2 diabetes mellitus without complications: Secondary | ICD-10-CM | POA: Diagnosis not present

## 2022-10-08 MED ORDER — DICLOFENAC SODIUM 1 % EX GEL: 2.0000 g | Freq: Four times a day (QID) | CUTANEOUS | 1 refills | Status: AC

## 2022-10-08 MED ORDER — GLIPIZIDE 5 MG PO TABS: 5.0000 mg | ORAL_TABLET | Freq: Every day | ORAL | 3 refills | Status: DC

## 2022-10-08 NOTE — Patient Instructions (Signed)
Medications as directed. ° °Call with concerns. ° °Take care ° °Dr. Parker Sawatzky  °

## 2022-10-10 DIAGNOSIS — M1990 Unspecified osteoarthritis, unspecified site: Secondary | ICD-10-CM | POA: Insufficient documentation

## 2022-10-10 NOTE — Progress Notes (Signed)
Subjective:  Patient ID: Joseph Burton, male    DOB: 1945-12-30  Age: 77 y.o. MRN: [id number]  CC: Chief Complaint  Patient presents with   right hand pain    Thumb and index finger swollen and pain with bump- couple of years now worse   Diabetes    Jardiance was not affordable, balls of feet and toes have loss of sensation - burning and pain    HPI:  77 year old male presents for evaluation the above.  Patient recently started [facility].  He states that he was unable to afford the medication.  It was too cost prohibitive despite being covered by his insurance.  We will discuss treatment options today.  Patient also reports that he is having ongoing pain of his right hand particularly of the right thumb and right index finger.  He would like to discuss treatment options regarding this.  He believes that this is secondary to osteoarthritis.  Patient Active Problem List   Diagnosis Date Noted   Osteoarthritis 10/10/2022   Primary hypertension 07/09/2020   OSA on CPAP 07/09/2020   Mixed hyperlipidemia 07/09/2020   Coronary artery disease involving native coronary artery of native heart with angina pectoris (HCC) 07/09/2020   Type 2 diabetes mellitus without complication, without long-term current use of insulin (HCC) 07/09/2020    Social Hx   Social History   Socioeconomic History   Marital status: Married    Spouse name: Not on file   Number of children: Not on file   Years of education: Not on file   Highest education level: Not on file  Occupational History   Occupation: retired    Associate Professor: [employer]  Tobacco Use   Smoking status: Never   Smokeless tobacco: Never  Vaping Use   Vaping Use: Never used  Substance and Sexual Activity   Alcohol use: No   Drug use: No   Sexual activity: Not on file  Other Topics Concern   Not on file  Social History Narrative   Not on file   Social Determinants of Health   Financial Resource Strain: Low Risk  (09/11/2022)    Overall Financial Resource Strain (CARDIA)    Difficulty of Paying Living Expenses: Not hard at all  Food Insecurity: No Food Insecurity (09/11/2022)   Hunger Vital Sign    Worried About Running Out of Food in the Last Year: Never true    Ran Out of Food in the Last Year: Never true  Transportation Needs: No Transportation Needs (09/11/2022)   PRAPARE - Administrator, Civil Service (Medical): No    Lack of Transportation (Non-Medical): No  Physical Activity: Sufficiently Active (09/11/2022)   Exercise Vital Sign    Days of Exercise per Week: 5 days    Minutes of Exercise per Session: 30 min  Stress: No Stress Concern Present (09/11/2022)   [employer] of Occupational Health - Occupational Stress Questionnaire    Feeling of Stress : Not at all  Social Connections: Socially Integrated (09/11/2022)   Social Connection and Isolation Panel [NHANES]    Frequency of Communication with Friends and Family: More than three times a week    Frequency of Social Gatherings with Friends and Family: More than three times a week    Attends Religious Services: More than 4 times per year    Active Member of [employer] or Organizations: Yes    Attends Engineer, structural: More than 4 times per year    Marital Status: Married  Review of Systems Per HPI  Objective:  BP (!) 152/77   Pulse 71   Temp 97.9 F (36.6 C)   Wt 192 lb (87.1 kg)   SpO2 96%   BMI 27.95 kg/m      10/08/2022    1:53 PM 09/11/2022   11:08 AM 06/25/2022    8:56 AM  BP/Weight  Systolic BP 152 128 137  Diastolic BP 77 72 74  Wt. (Lbs) 192 193.8 192  BMI 27.95 kg/m2 28.21 kg/m2 27.95 kg/m2    Physical Exam Vitals and nursing note reviewed.  Constitutional:      General: He is not in acute distress.    Appearance: Normal appearance.  HENT:     Head: Normocephalic and atraumatic.  Pulmonary:     Effort: Pulmonary effort is normal. No respiratory distress.  Musculoskeletal:     Comments: Right hand  with mild swelling of the PIP joint of the right index finger.  Neurological:     Mental Status: He is alert.  Psychiatric:        Mood and Affect: Mood normal.        Behavior: Behavior normal.     Lab Results  Component Value Date   WBC 7.7 06/25/2022   HGB 15.4 06/25/2022   HCT 43.7 06/25/2022   PLT 152 06/25/2022   GLUCOSE 252 (H) 06/25/2022   CHOL 132 06/25/2022   TRIG 158 (H) 06/25/2022   HDL 39 (L) 06/25/2022   LDLCALC 66 06/25/2022   ALT 38 06/25/2022   AST 27 06/25/2022   NA 139 06/25/2022   K 4.4 06/25/2022   CL 100 06/25/2022   CREATININE 1.12 06/25/2022   BUN 15 06/25/2022   CO2 20 06/25/2022   INR 1.07 07/16/2010   HGBA1C 7.7 (H) 06/25/2022     Assessment & Plan:   Problem List Items Addressed This Visit       Endocrine   Type 2 diabetes mellitus without complication, without long-term current use of insulin (HCC) - Primary    We discussed treatment options regarding type 2 diabetes.  He prefers to start a sulfonylurea given affordability.  Starting glipizide.      Relevant Medications   glipiZIDE (GLUCOTROL) 5 MG tablet     Musculoskeletal and Integument   Osteoarthritis    Will try topical diclofenac.       Meds ordered this encounter  Medications   glipiZIDE (GLUCOTROL) 5 MG tablet    Sig: Take 1 tablet (5 mg total) by mouth daily before breakfast.    Dispense:  90 tablet    Refill:  3   diclofenac Sodium (VOLTAREN ARTHRITIS PAIN) 1 % GEL    Sig: Apply 2 g topically 4 (four) times daily.    Dispense:  350 g    Refill:  1   Aizza Santiago DO [facility]

## 2022-10-10 NOTE — Assessment & Plan Note (Signed)
Will try topical diclofenac.

## 2022-10-10 NOTE — Assessment & Plan Note (Signed)
We discussed treatment options regarding type 2 diabetes.  He prefers to start a sulfonylurea given affordability.  Starting glipizide.

## 2022-10-21 ENCOUNTER — Telehealth: Payer: Self-pay | Admitting: Family Medicine

## 2022-10-21 ENCOUNTER — Other Ambulatory Visit: Payer: Self-pay

## 2022-10-21 NOTE — Telephone Encounter (Signed)
Patient was told at his last visit that his medication would be updated . He went to pick of medication nabumetone 750 mg is not on his med list and had his former doctor on his new prescription. Can someone update his med list last seen 06/25/22

## 2022-10-21 NOTE — Telephone Encounter (Signed)
Patient contacted, medication list updated.

## 2022-12-22 DIAGNOSIS — H524 Presbyopia: Secondary | ICD-10-CM | POA: Diagnosis not present

## 2022-12-22 DIAGNOSIS — H04123 Dry eye syndrome of bilateral lacrimal glands: Secondary | ICD-10-CM | POA: Diagnosis not present

## 2022-12-22 DIAGNOSIS — D492 Neoplasm of unspecified behavior of bone, soft tissue, and skin: Secondary | ICD-10-CM | POA: Diagnosis not present

## 2022-12-22 DIAGNOSIS — E119 Type 2 diabetes mellitus without complications: Secondary | ICD-10-CM | POA: Diagnosis not present

## 2022-12-22 DIAGNOSIS — H25813 Combined forms of age-related cataract, bilateral: Secondary | ICD-10-CM | POA: Diagnosis not present

## 2022-12-22 LAB — HM DIABETES EYE EXAM

## 2022-12-23 ENCOUNTER — Other Ambulatory Visit: Payer: Self-pay | Admitting: Family Medicine

## 2022-12-23 ENCOUNTER — Telehealth: Payer: Self-pay

## 2022-12-23 MED ORDER — TAMSULOSIN HCL 0.4 MG PO CAPS: 0.4000 mg | ORAL_CAPSULE | Freq: Every day | ORAL | 3 refills | Status: DC

## 2022-12-23 NOTE — Telephone Encounter (Signed)
Prescription Request  12/23/2022  LOV: Visit date not found  What is the name of the medication or equipment? tamsulosin (FLOMAX) 0.4 MG CAPS capsule   Have you contacted your pharmacy to request a refill? Yes   Which pharmacy would you like this sent to?  [facility] [address], [place] - [address] [place] [address] Phone: [phone] Fax: [phone]    Patient notified that their request is being sent to the clinical staff for review and that they should receive a response within 2 business days.   Please advise at Mobile [phone] (mobile)

## 2022-12-24 ENCOUNTER — Ambulatory Visit (INDEPENDENT_AMBULATORY_CARE_PROVIDER_SITE_OTHER): Payer: Medicare HMO | Admitting: Family Medicine

## 2022-12-24 VITALS — BP 140/70 | HR 64 | Temp 98.2°F | Ht 69.5 in | Wt 194.4 lb

## 2022-12-24 DIAGNOSIS — I10 Essential (primary) hypertension: Secondary | ICD-10-CM | POA: Diagnosis not present

## 2022-12-24 DIAGNOSIS — I25119 Atherosclerotic heart disease of native coronary artery with unspecified angina pectoris: Secondary | ICD-10-CM

## 2022-12-24 DIAGNOSIS — M19041 Primary osteoarthritis, right hand: Secondary | ICD-10-CM | POA: Diagnosis not present

## 2022-12-24 DIAGNOSIS — E1159 Type 2 diabetes mellitus with other circulatory complications: Secondary | ICD-10-CM

## 2022-12-24 DIAGNOSIS — E782 Mixed hyperlipidemia: Secondary | ICD-10-CM | POA: Diagnosis not present

## 2022-12-24 MED ORDER — NAPROXEN 500 MG PO TABS: 500.0000 mg | ORAL_TABLET | Freq: Two times a day (BID) | ORAL | 1 refills | Status: DC | PRN

## 2022-12-24 NOTE — Progress Notes (Signed)
Subjective:  Patient ID: Joseph Burton, male    DOB: 03-05-1946  Age: 77 y.o. MRN: [id number]  CC:  Follow up   HPI:  77 year old male with type 2 diabetes, coronary artery disease status post CABG, OSA, osteoarthritis, hyperlipidemia, hypertension presents for follow-up.  Coronary artery disease stable.  Asymptomatic.  Fair control of hypertension.  Remains on atenolol.  Patient has been taking Relafen for osteoarthritis.  He states that he is running out of this medication although he does not feel like it works extremely well.  He would like to discuss this today.  Last A1c was elevated at 7.7 with associated mild elevation of microalbumin to creatinine ratio.  He is currently on glipizide.  Did not want to take metformin.  SGLT2 was a cost issue.  Needs A1c today.  LDL has been at goal on Lipitor.  Patient Active Problem List   Diagnosis Date Noted   Osteoarthritis 10/10/2022   Primary hypertension 07/09/2020   OSA on CPAP 07/09/2020   Mixed hyperlipidemia 07/09/2020   Coronary artery disease involving native coronary artery of native heart with angina pectoris (HCC) 07/09/2020   Type 2 diabetes mellitus with other circulatory complication, without long-term current use of insulin (HCC) 07/09/2020    Social Hx   Social History   Socioeconomic History   Marital status: Married    Spouse name: Not on file   Number of children: Not on file   Years of education: Not on file   Highest education level: Not on file  Occupational History   Occupation: retired    Associate Professor: [employer]  Tobacco Use   Smoking status: Never   Smokeless tobacco: Never  Vaping Use   Vaping status: Never Used  Substance and Sexual Activity   Alcohol use: No   Drug use: No   Sexual activity: Not on file  Other Topics Concern   Not on file  Social History Narrative   Not on file   Social Determinants of Health   Financial Resource Strain: Low Risk  (09/11/2022)   Overall Financial  Resource Strain (CARDIA)    Difficulty of Paying Living Expenses: Not hard at all  Food Insecurity: No Food Insecurity (09/11/2022)   Hunger Vital Sign    Worried About Running Out of Food in the Last Year: Never true    Ran Out of Food in the Last Year: Never true  Transportation Needs: No Transportation Needs (09/11/2022)   PRAPARE - Administrator, Civil Service (Medical): No    Lack of Transportation (Non-Medical): No  Physical Activity: Sufficiently Active (09/11/2022)   Exercise Vital Sign    Days of Exercise per Week: 5 days    Minutes of Exercise per Session: 30 min  Stress: No Stress Concern Present (09/11/2022)   [employer] of Occupational Health - Occupational Stress Questionnaire    Feeling of Stress : Not at all  Social Connections: Socially Integrated (09/11/2022)   Social Connection and Isolation Panel [NHANES]    Frequency of Communication with Friends and Family: More than three times a week    Frequency of Social Gatherings with Friends and Family: More than three times a week    Attends Religious Services: More than 4 times per year    Active Member of [employer] or Organizations: Yes    Attends Engineer, structural: More than 4 times per year    Marital Status: Married    Review of Systems Per HPI  Objective:  BP Marland Kitchen)  140/70   Pulse 64   Temp 98.2 F (36.8 C)   Ht 5' 9.5" (1.765 m)   Wt 194 lb 6.4 oz (88.2 kg)   SpO2 96%   BMI 28.30 kg/m      12/24/2022    9:45 AM 10/08/2022    1:53 PM 09/11/2022   11:08 AM  BP/Weight  Systolic BP 140 152 128  Diastolic BP 70 77 72  Wt. (Lbs) 194.4 192 193.8  BMI 28.3 kg/m2 27.95 kg/m2 28.21 kg/m2    Physical Exam Vitals and nursing note reviewed.  Constitutional:      General: He is not in acute distress.    Appearance: Normal appearance.  Eyes:     General:        Right eye: No discharge.        Left eye: No discharge.     Conjunctiva/sclera: Conjunctivae normal.  Cardiovascular:      Rate and Rhythm: Normal rate and regular rhythm.  Pulmonary:     Effort: Pulmonary effort is normal.     Breath sounds: Normal breath sounds. No wheezing, rhonchi or rales.  Neurological:     Mental Status: He is alert.  Psychiatric:        Mood and Affect: Mood normal.        Behavior: Behavior normal.     Lab Results  Component Value Date   WBC 7.7 06/25/2022   HGB 15.4 06/25/2022   HCT 43.7 06/25/2022   PLT 152 06/25/2022   GLUCOSE 252 (H) 06/25/2022   CHOL 132 06/25/2022   TRIG 158 (H) 06/25/2022   HDL 39 (L) 06/25/2022   LDLCALC 66 06/25/2022   ALT 38 06/25/2022   AST 27 06/25/2022   NA 139 06/25/2022   K 4.4 06/25/2022   CL 100 06/25/2022   CREATININE 1.12 06/25/2022   BUN 15 06/25/2022   CO2 20 06/25/2022   INR 1.07 07/16/2010   HGBA1C 7.7 (H) 06/25/2022     Assessment & Plan:   Problem List Items Addressed This Visit       Cardiovascular and Mediastinum   Type 2 diabetes mellitus with other circulatory complication, without long-term current use of insulin (HCC)    A1c today.      Relevant Orders   Hemoglobin A1c   Primary hypertension    Fair control.  Continue atenolol.      Coronary artery disease involving native coronary artery of native heart with angina pectoris (HCC) - Primary    Stable.  Continue aspirin, beta-blocker, statin.        Musculoskeletal and Integument   Osteoarthritis    Discontinuing Relafen.  Advised as needed use of naproxen.  We discussed cardiovascular risk factors associated with chronic NSAID use.      Relevant Medications   naproxen (NAPROSYN) 500 MG tablet     Other   Mixed hyperlipidemia    LDL has been at goal.  Continue lipitor.       Meds ordered this encounter  Medications   naproxen (NAPROSYN) 500 MG tablet    Sig: Take 1 tablet (500 mg total) by mouth 2 (two) times daily as needed for moderate pain or mild pain.    Dispense:  60 tablet    Refill:  1    Follow-up:  3 months  Jalan Fariss Adriana Simas  DO [facility]

## 2022-12-24 NOTE — Assessment & Plan Note (Signed)
A1c today.  

## 2022-12-24 NOTE — Patient Instructions (Signed)
Labs today.  Naproxen as needed.  Follow up in 3 months.

## 2022-12-24 NOTE — Assessment & Plan Note (Signed)
LDL has been at goal.  Continue lipitor.

## 2022-12-24 NOTE — Telephone Encounter (Signed)
Cook, Jayce G, DO   ? ?Rx sent.   ? ?

## 2022-12-24 NOTE — Assessment & Plan Note (Signed)
Discontinuing Relafen.  Advised as needed use of naproxen.  We discussed cardiovascular risk factors associated with chronic NSAID use.

## 2022-12-24 NOTE — Assessment & Plan Note (Signed)
Stable.  Continue aspirin, beta blocker, statin

## 2022-12-24 NOTE — Assessment & Plan Note (Signed)
Fair control.  Continue atenolol.

## 2022-12-30 ENCOUNTER — Other Ambulatory Visit: Payer: Self-pay | Admitting: Family Medicine

## 2022-12-30 MED ORDER — GLIPIZIDE 5 MG PO TABS: 5.0000 mg | ORAL_TABLET | Freq: Two times a day (BID) | ORAL | 3 refills | Status: DC

## 2023-01-05 ENCOUNTER — Telehealth: Payer: Self-pay | Admitting: Family Medicine

## 2023-01-05 ENCOUNTER — Other Ambulatory Visit: Payer: Self-pay | Admitting: Family Medicine

## 2023-01-05 MED ORDER — TAMSULOSIN HCL 0.4 MG PO CAPS: 0.8000 mg | ORAL_CAPSULE | Freq: Every day | ORAL | 3 refills | Status: DC

## 2023-01-05 NOTE — Telephone Encounter (Signed)
Patient states his fluid pill for one day not helping he wants to go back to 2 a day if so he needs a refill on tamsulosin (FLOMAX) 0.4 MG CAPS capsule  with 2 a day instead of one.[facility]

## 2023-01-06 NOTE — Telephone Encounter (Signed)
Tommie Sams, DO   New rx sent.

## 2023-01-09 DIAGNOSIS — I2510 Atherosclerotic heart disease of native coronary artery without angina pectoris: Secondary | ICD-10-CM | POA: Diagnosis not present

## 2023-01-09 DIAGNOSIS — M1990 Unspecified osteoarthritis, unspecified site: Secondary | ICD-10-CM | POA: Diagnosis not present

## 2023-01-09 DIAGNOSIS — I10 Essential (primary) hypertension: Secondary | ICD-10-CM | POA: Diagnosis not present

## 2023-01-09 DIAGNOSIS — M7989 Other specified soft tissue disorders: Secondary | ICD-10-CM | POA: Diagnosis not present

## 2023-01-09 DIAGNOSIS — E119 Type 2 diabetes mellitus without complications: Secondary | ICD-10-CM | POA: Diagnosis not present

## 2023-01-09 DIAGNOSIS — Z8739 Personal history of other diseases of the musculoskeletal system and connective tissue: Secondary | ICD-10-CM | POA: Diagnosis not present

## 2023-01-09 DIAGNOSIS — M7731 Calcaneal spur, right foot: Secondary | ICD-10-CM | POA: Diagnosis not present

## 2023-01-09 DIAGNOSIS — M79604 Pain in right leg: Secondary | ICD-10-CM | POA: Diagnosis not present

## 2023-01-09 DIAGNOSIS — M79671 Pain in right foot: Secondary | ICD-10-CM | POA: Diagnosis not present

## 2023-01-09 DIAGNOSIS — L03115 Cellulitis of right lower limb: Secondary | ICD-10-CM | POA: Diagnosis not present

## 2023-01-09 DIAGNOSIS — E785 Hyperlipidemia, unspecified: Secondary | ICD-10-CM | POA: Diagnosis not present

## 2023-01-12 ENCOUNTER — Ambulatory Visit (INDEPENDENT_AMBULATORY_CARE_PROVIDER_SITE_OTHER): Payer: Medicare HMO | Admitting: Family Medicine

## 2023-01-12 ENCOUNTER — Encounter: Payer: Self-pay | Admitting: Family Medicine

## 2023-01-12 VITALS — BP 115/59 | HR 70 | Temp 98.1°F | Ht 69.0 in | Wt 193.4 lb

## 2023-01-12 DIAGNOSIS — M7989 Other specified soft tissue disorders: Secondary | ICD-10-CM

## 2023-01-12 DIAGNOSIS — I10 Essential (primary) hypertension: Secondary | ICD-10-CM | POA: Diagnosis not present

## 2023-01-12 DIAGNOSIS — E1159 Type 2 diabetes mellitus with other circulatory complications: Secondary | ICD-10-CM | POA: Diagnosis not present

## 2023-01-12 DIAGNOSIS — Z7984 Long term (current) use of oral hypoglycemic drugs: Secondary | ICD-10-CM | POA: Diagnosis not present

## 2023-01-12 MED ORDER — CEPHALEXIN 500 MG PO CAPS: 500.0000 mg | ORAL_CAPSULE | Freq: Three times a day (TID) | ORAL | 0 refills | Status: DC

## 2023-01-12 MED ORDER — IBUPROFEN 600 MG PO TABS: 600.0000 mg | ORAL_TABLET | Freq: Three times a day (TID) | ORAL | 0 refills | Status: DC | PRN

## 2023-01-12 MED ORDER — COLCHICINE 0.6 MG PO TABS: 0.6000 mg | ORAL_TABLET | Freq: Two times a day (BID) | ORAL | 2 refills | Status: DC

## 2023-01-12 NOTE — Patient Instructions (Signed)
Low-Purine Eating Plan A low-purine eating plan involves making food choices to limit your purine intake. Purine is a kind of uric acid. Too much uric acid in your blood can cause certain conditions, such as gout and kidney stones. Eating a low-purine diet may help control these conditions. What are tips for following this plan? Shopping Avoid buying products that contain high-fructose corn syrup. Check for this on food labels. It is commonly found in many processed foods and soft drinks. Be sure to check for it in baked goods such as cookies, canned fruits, and cereals and cereal bars. Avoid buying veal, chicken breast with skin, lamb, and organ meats such as liver. These types of meats tend to have the highest purine content. Choose dairy products. These may lower uric acid levels. Avoid certain types of fish. Not all fish and seafood have high purine content. Examples with high purine content include anchovies, trout, tuna, sardines, and salmon. Avoid buying beverages that contain alcohol, particularly beer and hard liquor. Alcohol can affect the way your body gets rid of uric acid. Meal planning  Learn which foods do or do not affect you. If you find out that a food tends to cause your gout symptoms to flare up, avoid eating that food. You can enjoy foods that do not cause problems. If you have any questions about a food item, talk with your dietitian or health care provider. Reduce the overall amount of meat in your diet. When you do eat meat, choose ones with lower purine content. Include plenty of fruits and vegetables. Although some vegetables may have a high purine content--such as asparagus, mushrooms, spinach, or cauliflower--it has been shown that these do not contribute to uric acid blood levels as much. Consume at least 1 dairy serving a day. This has been shown to decrease uric acid levels. General information If you drink alcohol: Limit how much you have to: 0-1 drink a day for  women who are not pregnant. 0-2 drinks a day for men. Know how much alcohol is in a drink. In the [place] one drink equals one 12 oz bottle of beer (355 mL), one 5 oz glass of wine (148 mL), or one 1 oz glass of hard liquor (44 mL). Drink plenty of water. Try to drink enough to keep your urine pale yellow. Fluids can help remove uric acid from your body. Work with your health care provider and dietitian to develop a plan to achieve or maintain a healthy weight. Losing weight may help reduce uric acid in your blood. What foods are recommended? The following are some types of foods that are good choices when limiting purine intake: Fresh or frozen fruits and vegetables. Whole grains, breads, cereals, and pasta. Rice. Beans, peas, legumes. Nuts and seeds. Dairy products. Fats and oils. The items listed above may not be a complete list. Talk with a dietitian about what dietary choices are best for you. What foods are not recommended? Limit your intake of foods high in purines, including: Beer and other alcohol. Meat-based gravy or sauce. Canned or fresh fish, such as: Anchovies, sardines, herring, salmon, and tuna. Mussels and scallops. Codfish, trout, and haddock. Bacon, veal, chicken breast with skin, and lamb. Organ meats, such as: Liver or kidney. Tripe. Sweetbreads (thymus gland or pancreas). Wild Education officer, environmental. Yeast or yeast extract supplements. Drinks sweetened with high-fructose corn syrup, such as soda. Processed foods made with high-fructose corn syrup. The items listed above may not be a complete list of foods  and beverages you should limit. Contact a dietitian for more information. Summary Eating a low-purine diet may help control conditions caused by too much uric acid in the body, such as gout or kidney stones. Choose low-purine foods, limit alcohol, and limit high-fructose corn syrup. You will learn over time which foods do or do not affect you. If you find out that a  food tends to cause your gout symptoms to flare up, avoid eating that food. This information is not intended to replace advice given to you by your health care provider. Make sure you discuss any questions you have with your health care provider. Document Revised: 04/17/2021 Document Reviewed: 04/17/2021 Elsevier Patient Education  2024 [employer].

## 2023-01-12 NOTE — Progress Notes (Signed)
   Subjective:    Patient ID: Joseph Burton, male    DOB: 1945/12/24, 77 y.o.   MRN: [id number]  HPI Patient following up on ER visit DC on 01/09/2023 for swollen foot (Right). Patient is still having pain, taking Ibuprofen 800mg  and Doxycyline 100mg . Patient diagnosed with cellulitis.  Labs from the hospital ER visit reviewed as well as x-ray and ultrasound negative for DVT negative for fracture lab work unremarkable uric acid 5.1 Review of Systems     Objective:   Physical Exam Foot has some swelling in it there is some redness at the base of the fifth metatarsal there is also redness and tenderness in the first MTP there is some slight swelling across the top of the foot but no obvious cellulitis this has more the appearance of gout  Thank you is normal calf is normal     Assessment & Plan:   Diabetes he has not increase his glipizide I encouraged him to start off with the 1 glipizide 5 mg in the morning and add a half a tablet at suppertime 1 week later may go to 5 mg in the morning and 5 mg at supper Also encouraged him to have a glucometer at home that he could check if he ever had low sugars and what to do for low sugars and to notify us if low sugars Patient does not desire to start any metformin he states he stopped that a while back because of concerns about potential side effects  Probable gout colchicine twice daily May continue the ibuprofen 3 times daily for 7 to 10 days no Naprosyn with this follow-up lab work in a few weeks time with uric acid metabolic 7 in the recheck with Dr. Adriana Simas

## 2023-01-13 ENCOUNTER — Ambulatory Visit: Payer: Medicare HMO | Admitting: Family Medicine

## 2023-01-26 DIAGNOSIS — M7989 Other specified soft tissue disorders: Secondary | ICD-10-CM | POA: Diagnosis not present

## 2023-01-26 DIAGNOSIS — E1159 Type 2 diabetes mellitus with other circulatory complications: Secondary | ICD-10-CM | POA: Diagnosis not present

## 2023-01-26 DIAGNOSIS — I10 Essential (primary) hypertension: Secondary | ICD-10-CM | POA: Diagnosis not present

## 2023-01-27 LAB — BASIC METABOLIC PANEL
BUN/Creatinine Ratio: 11 (ref 10–24)
BUN: 11 mg/dL (ref 8–27)
CO2: 21 mmol/L (ref 20–29)
Calcium: 9.5 mg/dL (ref 8.6–10.2)
Chloride: 104 mmol/L (ref 96–106)
Creatinine, Ser: 0.96 mg/dL (ref 0.76–1.27)
Glucose: 174 mg/dL — ABNORMAL HIGH (ref 70–99)
Potassium: 3.9 mmol/L (ref 3.5–5.2)
Sodium: 140 mmol/L (ref 134–144)
eGFR: 81 mL/min/{1.73_m2} (ref >59)

## 2023-01-27 LAB — URIC ACID: Uric Acid: 5.5 mg/dL (ref 3.8–8.4)

## 2023-01-28 ENCOUNTER — Ambulatory Visit (INDEPENDENT_AMBULATORY_CARE_PROVIDER_SITE_OTHER): Payer: Medicare HMO | Admitting: Family Medicine

## 2023-01-28 ENCOUNTER — Encounter: Payer: Self-pay | Admitting: Family Medicine

## 2023-01-28 VITALS — BP 118/54 | HR 62 | Temp 97.7°F | Wt 193.4 lb

## 2023-01-28 DIAGNOSIS — N401 Enlarged prostate with lower urinary tract symptoms: Secondary | ICD-10-CM

## 2023-01-28 DIAGNOSIS — N400 Enlarged prostate without lower urinary tract symptoms: Secondary | ICD-10-CM | POA: Insufficient documentation

## 2023-01-28 DIAGNOSIS — M791 Myalgia, unspecified site: Secondary | ICD-10-CM | POA: Insufficient documentation

## 2023-01-28 DIAGNOSIS — E782 Mixed hyperlipidemia: Secondary | ICD-10-CM | POA: Diagnosis not present

## 2023-01-28 MED ORDER — GLIPIZIDE 5 MG PO TABS: 5.0000 mg | ORAL_TABLET | Freq: Two times a day (BID) | ORAL | 3 refills | Status: DC

## 2023-01-28 MED ORDER — TAMSULOSIN HCL 0.4 MG PO CAPS: 0.8000 mg | ORAL_CAPSULE | Freq: Every day | ORAL | 3 refills | Status: DC

## 2023-01-28 MED ORDER — REPATHA SURECLICK 140 MG/ML ~~LOC~~ SOAJ: 140.0000 mg | SUBCUTANEOUS | 3 refills | Status: DC

## 2023-01-28 NOTE — Progress Notes (Signed)
Subjective:  Patient ID: Joseph Burton, male    DOB: May 01, 1946  Age: 77 y.o. MRN: [id number]  CC: Chief Complaint  Patient presents with   Follow-up    2 week follow up for swollen right foot.     HPI:  77 year old male presents for follow-up.  Recent foot swelling and cellulitis.  Has now resolved following Keflex and colchicine.  He states he still having some pain of the right great toe but overall he is doing well.  Patient reports that since his Flomax was decreased he seems to be having some urinary difficulty.  He would like to increase back to 0.8 mg daily.  Patient also reports ongoing muscle aches and pains.  He states that he has previously not tolerated statins well.  I suspect that he is having myalgias from statin.  Will discuss discontinuation today.  No other complaints or concerns at this time. Patient Active Problem List   Diagnosis Date Noted   Myalgia 01/28/2023   BPH (benign prostatic hyperplasia) 01/28/2023   Osteoarthritis 10/10/2022   Primary hypertension 07/09/2020   OSA on CPAP 07/09/2020   Mixed hyperlipidemia 07/09/2020   Coronary artery disease involving native coronary artery of native heart with angina pectoris (HCC) 07/09/2020   Type 2 diabetes mellitus with other circulatory complication, without long-term current use of insulin (HCC) 07/09/2020    Social Hx   Social History   Socioeconomic History   Marital status: Married    Spouse name: Not on file   Number of children: Not on file   Years of education: Not on file   Highest education level: Not on file  Occupational History   Occupation: retired    Associate Professor: [employer]  Tobacco Use   Smoking status: Never   Smokeless tobacco: Never  Vaping Use   Vaping status: Never Used  Substance and Sexual Activity   Alcohol use: No   Drug use: No   Sexual activity: Not on file  Other Topics Concern   Not on file  Social History Narrative   Not on file   Social Determinants of  Health   Financial Resource Strain: Low Risk  (09/11/2022)   Overall Financial Resource Strain (CARDIA)    Difficulty of Paying Living Expenses: Not hard at all  Food Insecurity: No Food Insecurity (09/11/2022)   Hunger Vital Sign    Worried About Running Out of Food in the Last Year: Never true    Ran Out of Food in the Last Year: Never true  Transportation Needs: No Transportation Needs (09/11/2022)   PRAPARE - Administrator, Civil Service (Medical): No    Lack of Transportation (Non-Medical): No  Physical Activity: Sufficiently Active (09/11/2022)   Exercise Vital Sign    Days of Exercise per Week: 5 days    Minutes of Exercise per Session: 30 min  Stress: No Stress Concern Present (09/11/2022)   [employer] of Occupational Health - Occupational Stress Questionnaire    Feeling of Stress : Not at all  Social Connections: Unknown (01/09/2023)   Received from [employer]   Social Network    Social Network: Not on file    Review of Systems Per HPI  Objective:  BP (!) 118/54   Pulse 62   Temp 97.7 F (36.5 C) (Temporal)   Wt 193 lb 6.4 oz (87.7 kg)   SpO2 98%   BMI 28.56 kg/m      01/28/2023   10:33 AM 01/12/2023  4:28 PM 12/24/2022    9:45 AM  BP/Weight  Systolic BP 118 115 140  Diastolic BP 54 59 70  Wt. (Lbs) 193.4 193.4 194.4  BMI 28.56 kg/m2 28.56 kg/m2 28.3 kg/m2    Physical Exam Constitutional:      General: He is not in acute distress.    Appearance: Normal appearance.  HENT:     Head: Normocephalic and atraumatic.  Eyes:     General:        Right eye: No discharge.        Left eye: No discharge.     Conjunctiva/sclera: Conjunctivae normal.  Pulmonary:     Effort: Pulmonary effort is normal. No respiratory distress.  Musculoskeletal:     Comments: Right foot: No erythema or warmth.  No significant swelling of the great toe.  Neurological:     Mental Status: He is alert.  Psychiatric:        Mood and Affect: Mood normal.         Behavior: Behavior normal.     Lab Results  Component Value Date   WBC 7.7 06/25/2022   HGB 15.4 06/25/2022   HCT 43.7 06/25/2022   PLT 152 06/25/2022   GLUCOSE 174 (H) 01/26/2023   CHOL 132 06/25/2022   TRIG 158 (H) 06/25/2022   HDL 39 (L) 06/25/2022   LDLCALC 66 06/25/2022   ALT 38 06/25/2022   AST 27 06/25/2022   NA 140 01/26/2023   K 3.9 01/26/2023   CL 104 01/26/2023   CREATININE 0.96 01/26/2023   BUN 11 01/26/2023   CO2 21 01/26/2023   INR 1.07 07/16/2010   HGBA1C 7.9 (H) 12/24/2022     Assessment & Plan:   Problem List Items Addressed This Visit       Genitourinary   BPH (benign prostatic hyperplasia)    Having more difficulty since decrease in dose of Flomax.  Increasing back to 0.8 mg.      Relevant Medications   tamsulosin (FLOMAX) 0.4 MG CAPS capsule     Other   Myalgia - Primary    Stopping atorvastatin.      Mixed hyperlipidemia    Stopping atorvastatin.  Starting Repatha.      Relevant Medications   Evolocumab (REPATHA SURECLICK) 140 MG/ML SOAJ    Meds ordered this encounter  Medications   tamsulosin (FLOMAX) 0.4 MG CAPS capsule    Sig: Take 2 capsules (0.8 mg total) by mouth daily.    Dispense:  180 capsule    Refill:  3   glipiZIDE (GLUCOTROL) 5 MG tablet    Sig: Take 1 tablet (5 mg total) by mouth 2 (two) times daily before a meal.    Dispense:  180 tablet    Refill:  3   Evolocumab (REPATHA SURECLICK) 140 MG/ML SOAJ    Sig: Inject 140 mg into the skin every 14 (fourteen) days.    Dispense:  6 mL    Refill:  3    Follow-up:  Return in about 6 weeks (around 03/11/2023).  Everlene Other DO [facility]

## 2023-01-28 NOTE — Assessment & Plan Note (Signed)
Stopping atorvastatin.

## 2023-01-28 NOTE — Assessment & Plan Note (Signed)
Having more difficulty since decrease in dose of Flomax.  Increasing back to 0.8 mg.

## 2023-01-28 NOTE — Patient Instructions (Addendum)
Stop Lipitor.  Medications refilled.  Rx sent for Repatha.  Follow up in 6 weeks

## 2023-01-28 NOTE — Assessment & Plan Note (Signed)
Stopping atorvastatin.  Starting Repatha.

## 2023-02-09 ENCOUNTER — Ambulatory Visit: Payer: Medicare HMO | Admitting: Family Medicine

## 2023-02-18 DIAGNOSIS — H524 Presbyopia: Secondary | ICD-10-CM | POA: Diagnosis not present

## 2023-03-03 ENCOUNTER — Ambulatory Visit: Payer: Medicare HMO | Admitting: Family Medicine

## 2023-03-05 ENCOUNTER — Ambulatory Visit: Payer: Medicare HMO | Admitting: Family Medicine

## 2023-03-05 VITALS — BP 130/78 | HR 75 | Temp 97.9°F | Ht 69.0 in | Wt 194.8 lb

## 2023-03-05 DIAGNOSIS — L409 Psoriasis, unspecified: Secondary | ICD-10-CM | POA: Diagnosis not present

## 2023-03-05 DIAGNOSIS — E782 Mixed hyperlipidemia: Secondary | ICD-10-CM

## 2023-03-05 MED ORDER — ATORVASTATIN CALCIUM 20 MG PO TABS: 20.0000 mg | ORAL_TABLET | Freq: Every day | ORAL | 3 refills | Status: DC

## 2023-03-05 NOTE — Patient Instructions (Signed)
Back on Atorvastatin.  Let me know if you would like a referral to Dermatology.  Follow up in 6 months.  Take care  Dr. Adriana Simas

## 2023-03-07 DIAGNOSIS — L409 Psoriasis, unspecified: Secondary | ICD-10-CM | POA: Insufficient documentation

## 2023-03-07 NOTE — Progress Notes (Signed)
Subjective:  Patient ID: Joseph Burton, male    DOB: Sep 17, 1945  Age: 77 y.o. MRN: [id number]  CC:  6 week follow up   HPI:  77 year old male presents for follow-up.  Patient reports that he has not noticed much improvement in his aches and pains after discontinuation of atorvastatin.  Will discuss restarting.  He did not get the Repatha due to cost.  Patient reports difficulty with psoriasis.  He would like to discuss treatment and possible referral today.  Patient Active Problem List   Diagnosis Date Noted   Psoriasis 03/07/2023   Myalgia 01/28/2023   BPH (benign prostatic hyperplasia) 01/28/2023   Osteoarthritis 10/10/2022   Primary hypertension 07/09/2020   OSA on CPAP 07/09/2020   Mixed hyperlipidemia 07/09/2020   Coronary artery disease involving native coronary artery of native heart with angina pectoris (HCC) 07/09/2020   Type 2 diabetes mellitus with other circulatory complication, without long-term current use of insulin (HCC) 07/09/2020    Social Hx   Social History   Socioeconomic History   Marital status: Married    Spouse name: Not on file   Number of children: Not on file   Years of education: Not on file   Highest education level: Not on file  Occupational History   Occupation: retired    Associate Professor: [employer]  Tobacco Use   Smoking status: Never   Smokeless tobacco: Never  Vaping Use   Vaping status: Never Used  Substance and Sexual Activity   Alcohol use: No   Drug use: No   Sexual activity: Not on file  Other Topics Concern   Not on file  Social History Narrative   Not on file   Social Determinants of Health   Financial Resource Strain: Low Risk  (09/11/2022)   Overall Financial Resource Strain (CARDIA)    Difficulty of Paying Living Expenses: Not hard at all  Food Insecurity: No Food Insecurity (09/11/2022)   Hunger Vital Sign    Worried About Running Out of Food in the Last Year: Never true    Ran Out of Food in the Last Year: Never  true  Transportation Needs: No Transportation Needs (09/11/2022)   PRAPARE - Administrator, Civil Service (Medical): No    Lack of Transportation (Non-Medical): No  Physical Activity: Sufficiently Active (09/11/2022)   Exercise Vital Sign    Days of Exercise per Week: 5 days    Minutes of Exercise per Session: 30 min  Stress: No Stress Concern Present (09/11/2022)   [employer] of Occupational Health - Occupational Stress Questionnaire    Feeling of Stress : Not at all  Social Connections: Unknown (01/09/2023)   Received from [employer]   Social Network    Social Network: Not on file    Review of Systems Per HPI  Objective:  BP 130/78   Pulse 75   Temp 97.9 F (36.6 C)   Ht 5\' 9"  (1.753 m)   Wt 194 lb 12.8 oz (88.4 kg)   SpO2 98%   BMI 28.77 kg/m      03/05/2023   10:55 AM 01/28/2023   10:33 AM 01/12/2023    4:28 PM  BP/Weight  Systolic BP 130 118 115  Diastolic BP 78 54 59  Wt. (Lbs) 194.8 193.4 193.4  BMI 28.77 kg/m2 28.56 kg/m2 28.56 kg/m2    Physical Exam Constitutional:      General: He is not in acute distress.    Appearance: Normal appearance.  HENT:  Head: Normocephalic and atraumatic.  Eyes:     General:        Right eye: No discharge.        Left eye: No discharge.     Conjunctiva/sclera: Conjunctivae normal.  Cardiovascular:     Rate and Rhythm: Normal rate and regular rhythm.  Pulmonary:     Effort: Pulmonary effort is normal.     Breath sounds: Normal breath sounds. No wheezing or rales.  Skin:    Comments: Scattered plaques noted particularly at the elbow.  Neurological:     Mental Status: He is alert.     Lab Results  Component Value Date   WBC 7.7 06/25/2022   HGB 15.4 06/25/2022   HCT 43.7 06/25/2022   PLT 152 06/25/2022   GLUCOSE 174 (H) 01/26/2023   CHOL 132 06/25/2022   TRIG 158 (H) 06/25/2022   HDL 39 (L) 06/25/2022   LDLCALC 66 06/25/2022   ALT 38 06/25/2022   AST 27 06/25/2022   NA 140 01/26/2023    K 3.9 01/26/2023   CL 104 01/26/2023   CREATININE 0.96 01/26/2023   BUN 11 01/26/2023   CO2 21 01/26/2023   INR 1.07 07/16/2010   HGBA1C 7.9 (H) 12/24/2022     Assessment & Plan:   Problem List Items Addressed This Visit       Musculoskeletal and Integument   Psoriasis    Discussed the need for systemic treatment.  Recommend referral.  Patient will consider.  He wants to think about this.        Other   Mixed hyperlipidemia - Primary    Restarting statin.      Relevant Medications   atorvastatin (LIPITOR) 20 MG tablet    Meds ordered this encounter  Medications   atorvastatin (LIPITOR) 20 MG tablet    Sig: Take 1 tablet (20 mg total) by mouth daily.    Dispense:  90 tablet    Refill:  3    Follow-up:  Return in about 6 months (around 09/03/2023) for Follow up Chronic medical issues.  Everlene Other DO [facility]

## 2023-03-07 NOTE — Assessment & Plan Note (Signed)
Restarting statin ?

## 2023-03-07 NOTE — Assessment & Plan Note (Signed)
Discussed the need for systemic treatment.  Recommend referral.  Patient will consider.  He wants to think about this.

## 2023-03-11 ENCOUNTER — Ambulatory Visit: Payer: Medicare HMO | Admitting: Family Medicine

## 2023-04-28 ENCOUNTER — Telehealth: Payer: Self-pay | Admitting: Family Medicine

## 2023-04-28 ENCOUNTER — Telehealth: Payer: Self-pay

## 2023-04-28 NOTE — Telephone Encounter (Signed)
Rx request for Medication: clobetasol cream (TEMOVATE) 0.05 %   Do not see on med list

## 2023-04-28 NOTE — Telephone Encounter (Unsigned)
Copied from CRM [id number]. Topic: Clinical - Medication Refill >> Apr 28, 2023  3:06 PM Desma Mcgregor wrote: Most Recent Primary Care Visit:  Provider: Tommie Sams  Department: [facility]  Visit Type: OFFICE VISIT  Date: 03/05/2023  Medication: clobetasol cream (TEMOVATE) 0.05 %  Has the patient contacted their pharmacy? No (Agent: If no, request that the patient contact the pharmacy for the refill. If patient does not wish to contact the pharmacy document the reason why and proceed with request.) (Agent: If yes, when and what did the pharmacy advise?)  Is this the correct pharmacy for this prescription? No If no, delete pharmacy and type the correct one.  This is the patient's preferred pharmacy:  [facility] [address], [place] - [address] [place] [address] Phone: [phone] Fax: [phone]   Has the prescription been filled recently? No  Is the patient out of the medication? {yes/no:20286}  Has the patient been seen for an appointment in the last year OR does the patient have an upcoming appointment? Yes  Can we respond through MyChart? No  Agent: Please be advised that Rx refills may take up to 3 business days. We ask that you follow-up with your pharmacy.

## 2023-04-29 ENCOUNTER — Other Ambulatory Visit: Payer: Self-pay | Admitting: Family Medicine

## 2023-04-29 MED ORDER — CLOBETASOL PROPIONATE 0.05 % EX CREA: 1.0000 | TOPICAL_CREAM | Freq: Two times a day (BID) | CUTANEOUS | 1 refills | Status: DC

## 2023-05-16 DIAGNOSIS — J029 Acute pharyngitis, unspecified: Secondary | ICD-10-CM | POA: Diagnosis not present

## 2023-05-16 DIAGNOSIS — R051 Acute cough: Secondary | ICD-10-CM | POA: Diagnosis not present

## 2023-05-16 DIAGNOSIS — J019 Acute sinusitis, unspecified: Secondary | ICD-10-CM | POA: Diagnosis not present

## 2023-05-16 DIAGNOSIS — U071 COVID-19: Secondary | ICD-10-CM | POA: Diagnosis not present

## 2023-05-16 DIAGNOSIS — B349 Viral infection, unspecified: Secondary | ICD-10-CM | POA: Diagnosis not present

## 2023-05-16 DIAGNOSIS — J101 Influenza due to other identified influenza virus with other respiratory manifestations: Secondary | ICD-10-CM | POA: Diagnosis not present

## 2023-05-24 ENCOUNTER — Ambulatory Visit: Payer: Medicare Other | Attending: Cardiology | Admitting: Cardiology

## 2023-05-24 ENCOUNTER — Ambulatory Visit: Payer: Medicare Other | Admitting: Nurse Practitioner

## 2023-05-24 ENCOUNTER — Encounter: Payer: Self-pay | Admitting: Nurse Practitioner

## 2023-05-24 ENCOUNTER — Encounter: Payer: Self-pay | Admitting: Cardiology

## 2023-05-24 VITALS — BP 122/62 | HR 74 | Ht 69.5 in | Wt 197.2 lb

## 2023-05-24 VITALS — BP 136/75 | HR 66 | Temp 97.9°F | Ht 69.5 in | Wt 198.0 lb

## 2023-05-24 DIAGNOSIS — E782 Mixed hyperlipidemia: Secondary | ICD-10-CM | POA: Diagnosis not present

## 2023-05-24 DIAGNOSIS — J069 Acute upper respiratory infection, unspecified: Secondary | ICD-10-CM | POA: Diagnosis not present

## 2023-05-24 DIAGNOSIS — I10 Essential (primary) hypertension: Secondary | ICD-10-CM

## 2023-05-24 DIAGNOSIS — B9689 Other specified bacterial agents as the cause of diseases classified elsewhere: Secondary | ICD-10-CM | POA: Diagnosis not present

## 2023-05-24 DIAGNOSIS — I25119 Atherosclerotic heart disease of native coronary artery with unspecified angina pectoris: Secondary | ICD-10-CM

## 2023-05-24 MED ORDER — AMOXICILLIN-POT CLAVULANATE 875-125 MG PO TABS: 1.0000 | ORAL_TABLET | Freq: Two times a day (BID) | ORAL | 0 refills | Status: DC

## 2023-05-24 MED ORDER — NITROGLYCERIN 0.4 MG SL SUBL: 0.4000 mg | SUBLINGUAL_TABLET | SUBLINGUAL | 3 refills | Status: DC | PRN

## 2023-05-24 NOTE — Progress Notes (Signed)
   Subjective:    Patient ID: Joseph Burton, male    DOB: [birth year], 78 y.o.   MRN: [id number]  HPI Presents with his wife for complaints of congestion over the past 10 days.  Cough mainly in the morning producing yellow-green mucus.  No fever sore throat or ear pain.  No chest pain tightness shortness of breath or wheezing.  Produces a lot of mucus for the first few hours of every day.  Was seen at urgent care last week and prescribed a cough syrup.  Minimal improvement and now has color to his drainage.  Also taking Mucinex OTC for congestion.   Social History   Tobacco Use   Smoking status: Never   Smokeless tobacco: Never  Vaping Use   Vaping status: Never Used  Substance Use Topics   Alcohol  use: No   Drug use: No        Objective:   Physical Exam NAD.  Alert, oriented.  TMs clear effusion, no erythema.  Pharynx not erythematous with PND noted.  Mucous membranes moist.  Neck supple with mild soft anterior cervical adenopathy.  Lungs clear.  Heart regular rate rhythm. Today's Vitals   05/24/23 1530  BP: 136/75  Pulse: 66  Temp: 97.9 F (36.6 C)  SpO2: 98%  Weight: 198 lb (89.8 kg)  Height: 5' 9.5 (1.765 m)   Body mass index is 28.82 kg/m.        Assessment & Plan:  Bacterial URI Meds ordered this encounter  Medications   amoxicillin -clavulanate (AUGMENTIN ) 875-125 MG tablet    Sig: Take 1 tablet by mouth 2 (two) times daily.    Dispense:  14 tablet    Refill:  0    Supervising Provider:   ALPHONSA HAMILTON A [[id number]]   Continue Mucinex as directed.  Add steroid nasal spray as directed. Warning signs reviewed.  Call back in 7 to 10 days if no improvement, sooner if worse.

## 2023-05-24 NOTE — Patient Instructions (Signed)
 Steroid nasal spray such as flonase, nasacort or nasonex

## 2023-05-24 NOTE — Progress Notes (Signed)
    Cardiology Office Note  Date: 05/24/2023   ID: Joseph Burton, DOB [birth year], MRN [id number]  History of Present Illness: Joseph Burton is a 78 y.o. male last seen in August 2023.  He is here with his wife for a follow-up visit.  In the interim reports no increasing exertional chest pain or unusual dyspnea on exertion with typical activities.  States that he sometimes has somewhat less stamina after he eats.  No palpitations or syncope.  I reviewed his medications.  Current cardiovascular regimen includes aspirin , atenolol , and Lipitor.  Blood pressure is well-controlled today.  His LDL was 66 in September 2024.  He continues to see Dr. Bluford.  I reviewed his ECG today which shows sinus rhythm with rightward axis, nonspecific ST-T changes.  Physical Exam: VS:  BP 122/62   Pulse 74   Ht 5' 9.5 (1.765 m)   Wt 197 lb 3.2 oz (89.4 kg)   SpO2 95%   BMI 28.70 kg/m , BMI Body mass index is 28.7 kg/m.  Wt Readings from Last 3 Encounters:  05/24/23 197 lb 3.2 oz (89.4 kg)  03/05/23 194 lb 12.8 oz (88.4 kg)  01/28/23 193 lb 6.4 oz (87.7 kg)    General: Patient appears comfortable at rest. HEENT: Conjunctiva and lids normal. Neck: Supple, no elevated JVP or carotid bruits. Lungs: Clear to auscultation, nonlabored breathing at rest. Cardiac: Regular rate and rhythm, no S3, 2/6 systolic murmur. Extremities: No pitting edema.  ECG:  An ECG dated 12/22/2021 was personally reviewed today and demonstrated:  Sinus rhythm with nonspecific T wave changes.  Labwork: 06/25/2022: ALT 38; AST 27; Hemoglobin 15.4; Platelets 152 01/26/2023: BUN 11; Creatinine, Ser 0.96; Potassium 3.9; Sodium 140     Component Value Date/Time   CHOL 132 06/25/2022 0944   TRIG 158 (H) 06/25/2022 0944   HDL 39 (L) 06/25/2022 0944   CHOLHDL 3.4 06/25/2022 0944   LDLCALC 66 06/25/2022 0944   Other Studies Reviewed Today:  No interval cardiac testing for review today.  Assessment and Plan:  1.   Multivessel CAD status post CABG in 2002 (unable to locate graft anatomy in chart).  LVEF 65 to 70% by echocardiogram in 2017 at which point Myoview  indicated no significant ischemia.  He reports no progressive symptoms, provided refill for as needed nitroglycerin .  Continue aspirin  and Lipitor.  Annual follow-up continues, sooner if symptoms intervene.  2.  Mixed hyperlipidemia, LDL 66 in September 2024.  Continue Lipitor.  3.  Primary hypertension.  Blood pressure well-controlled on atenolol .  Disposition:  Follow up  1 year.  Signed, Jayson JUDITHANN Sierras, M.D., F.A.C.C. [facility]

## 2023-05-24 NOTE — Patient Instructions (Addendum)

## 2023-06-29 ENCOUNTER — Other Ambulatory Visit: Payer: Self-pay | Admitting: Family Medicine

## 2023-06-29 MED ORDER — AZITHROMYCIN 250 MG PO TABS
ORAL_TABLET | ORAL | 0 refills | Status: AC
Start: 2023-06-29 — End: 2023-07-04

## 2023-07-21 ENCOUNTER — Ambulatory Visit: Admitting: Family Medicine

## 2023-07-23 ENCOUNTER — Other Ambulatory Visit: Payer: Self-pay | Admitting: Family Medicine

## 2023-08-13 NOTE — Progress Notes (Signed)
 Joseph Burton, male    DOB: 04/15/1946    MRN: [id number]   Brief patient profile:  62  yowm never smoker with DM/ HBP  self- referred to pulmonary clinic in [place]  08/16/2023  for cough since xmas '24  flu- like illness >> UC/ PCP rx cough syrup /augmentin/ covid test neg   Son and his family similar illness p being altogether at [facility] - no one tested pos for Flu or covid per pt    History of Present Illness  08/16/2023  Pulmonary/ 1st office eval/ Joseph Burton / [place] [facility]  Chief Complaint  Patient presents with   Establish Care  Dyspnea:  more difficult hauling trash to street since coughing started  Cough: minimal after stirring assoc with globus and slt voice fatigue  Sleep: flat bed / one pillow and settles down s cough noct  SABA use: none  02: none  Mouthwash makes cough worse = drugstore equivalent of listarine  Sev years prior to OV  saw ENT for rhinitis rx ipatroprium helps but not cough     No obvious day to day or daytime pattern/variability or assoc excess/ purulent sputum or mucus plugs or hemoptysis or cp or chest tightness, subjective wheeze or overt sinus or hb symptoms.   Also denies any obvious fluctuation of symptoms with weather or environmental changes or other aggravating or alleviating factors except as outlined above   No unusual exposure hx or h/o childhood pna/ asthma or knowledge of premature birth.  Current Allergies, Complete Past Medical History, Past Surgical History, Family History, and Social History were reviewed in [employer] record.  ROS  The following are not active complaints unless bolded Hoarseness, sore throat, dysphagia, dental problems, itching, sneezing,  nasal congestion or discharge of excess mucus or purulent secretions, ear ache,   fever, chills, sweats, unintended wt loss or wt gain, classically pleuritic or exertional cp,  orthopnea pnd or arm/hand swelling  or leg swelling, presyncope,  palpitations, abdominal pain, anorexia, nausea, vomiting, diarrhea  or change in bowel habits or change in bladder habits, change in stools or change in urine, dysuria, hematuria,  rash, arthralgias, visual complaints, headache, numbness, weakness or ataxia or problems with walking or coordination,  change in mood or  memory.            Outpatient Medications Prior to Visit  Medication Sig Dispense Refill   aspirin EC 81 MG tablet Take 81 mg by mouth every other day. Swallow whole.     atenolol (TENORMIN) 25 MG tablet TAKE 1 TABLET BY MOUTH AT BEDTIME 90 tablet 1   atorvastatin (LIPITOR) 20 MG tablet Take 1 tablet (20 mg total) by mouth daily. 90 tablet 3   calcium carbonate (OS-CAL) 600 MG TABS tablet Take 600 mg by mouth 2 (two) times daily with a meal.     carboxymethylcellulose (REFRESH PLUS) 0.5 % SOLN Place 1 drop into both eyes 3 (three) times daily as needed (for dry/irritated/watery eyes.).     CINNAMON PO Take 1,000 mg by mouth in the morning and at bedtime.     clobetasol cream (TEMOVATE) 0.05 % Apply 1 Application topically 2 (two) times daily. 60 g 1   Cyanocobalamin (VITAMIN B-12) 2500 MCG SUBL Take 2,500 mcg by mouth in the morning.     diclofenac Sodium (VOLTAREN ARTHRITIS PAIN) 1 % GEL Apply 2 g topically 4 (four) times daily. 350 g 1   Flaxseed, Linseed, (FLAX SEED OIL) 1000 MG CAPS Take  by mouth.     folic acid (FOLVITE) 400 MCG tablet Take 400 mcg by mouth in the morning.     glipiZIDE (GLUCOTROL) 5 MG tablet Take 1 tablet (5 mg total) by mouth 2 (two) times daily before a meal. 180 tablet 3   ibuprofen (ADVIL) 600 MG tablet Take 1 tablet (600 mg total) by mouth every 8 (eight) hours as needed. 30 tablet 0   ipratropium (ATROVENT) 0.06 % nasal spray Place 2 sprays into both nostrils 3 (three) times daily as needed (NASAL CONGESTION/ALLERGIES.).      naproxen (NAPROSYN) 500 MG tablet Take 1 tablet (500 mg total) by mouth 2 (two) times daily as needed for moderate pain or mild  pain. 60 tablet 1   nitroGLYCERIN (NITROSTAT) 0.4 MG SL tablet Place 1 tablet (0.4 mg total) under the tongue every 5 (five) minutes x 3 doses as needed for chest pain (if no relief after 2nd dose, proceed to ED or call 911). 25 tablet 3   Potassium 99 MG TABS Take by mouth.     pyridOXINE (VITAMIN B-6) 100 MG tablet Take 100 mg by mouth in the morning.     Red Yeast Rice Extract 600 MG CAPS Take 600 mg by mouth every evening.     tamsulosin (FLOMAX) 0.4 MG CAPS capsule Take 2 capsules (0.8 mg total) by mouth daily. 180 capsule 3   Zinc 50 MG TABS Take 50 mg by mouth in the morning.     colchicine 0.6 MG tablet Take 1 tablet (0.6 mg total) by mouth 2 (two) times daily. (Patient not taking: Reported on 08/16/2023) 30 tablet 2   amoxicillin-clavulanate (AUGMENTIN) 875-125 MG tablet Take 1 tablet by mouth 2 (two) times daily. 14 tablet 0   No facility-administered medications prior to visit.    Past Medical History:  Diagnosis Date   Arthritis    Bloating 03/16/2018   Coronary artery disease    Multivessel status post CABG 2002   Early cataracts, bilateral    Environmental and seasonal allergies    Fatty liver    GERD (gastroesophageal reflux disease)    Hearing loss    Hypertension    Sleep apnea    Type 2 diabetes mellitus (HCC)    Umbilical hernia       Objective:     BP (!) 148/72   Pulse 76   Ht 5' 9.5" (1.765 m)   Wt 198 lb (89.8 kg)   SpO2 94%   BMI 28.82 kg/m   SpO2: 94 % pleasant amb wm nad   HEENT : Oropharynx  clear    Nasal turbinates mild non-specific edema/ no polyps    NECK :  without  apparent JVD/ palpable Nodes/TM    LUNGS: no acc muscle use,  Nl contour chest which is clear to A and P bilaterally without cough on insp or exp maneuvers   CV:  RRR  no s3 or murmur or increase in P2, and no edema   ABD:  soft and nontender   MS:  Gait nl   ext warm without deformities Or obvious joint restrictions  calf tenderness, cyanosis or clubbing    SKIN:  warm and dry without lesions    NEURO:  alert, approp, nl sensorium with  no motor or cerebellar deficits apparent.      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   These are the same pts (now being labeled as having "irritable larynx syndrome" by some cough centers) who not infrequently have a history of having failed to tolerate ace inhibitors,  dry powder inhalers or biphosphonates or report having atypical/extraesophageal (LPR) reflux symptoms that don't respond to standard doses of PPI  and are easily confused as having aecopd or asthma flares by even experienced allergists/ pulmonologists (myself included).   Of the three most common causes of  Sub-acute / recurrent or chronic cough, only one (GERD)  can actually contribute to/ trigger  the other two (asthma and post nasal drip syndrome)  and perpetuate the cylce of cough.  While not intuitively obvious, many patients with chronic low grade reflux do not cough until there is a primary insult that disturbs the protective epithelial barrier and exposes sensitive nerve endings.   This is typically viral but can due to PNDS and  either may apply here (not he had apparent vasomotor rhinitis dx by ent well before onset of  cough)    The point is that once this occurs, it is difficult to eliminate the cycle  using anything but a maximally effective acid suppression regimen at least in the short run, accompanied by an appropriate diet to address non acid GERD and control / eliminate the cough itself for at least 3-5 days with tessalon 200 and hard rock candy >>> also added depomedrol 120 mg IM  in case of component of Th-2 driven upper or lower airways inflammation (if cough responds short term only to relapse before return while will on full rx for uacs (as above), then that would point to allergic rhinitis/ asthma or eos bronchitis as alternative dx)    F/u in 3 weeks if not better, or with ENT locally.          Each maintenance medication was reviewed in detail including emphasizing most importantly the difference between maintenance and prns and under what circumstances the prns are to be triggered using an action plan format where appropriate.  Total time for H and P, chart review, counseling,  and generating customized AVS unique to this office visit / same day charting = 48 min new pt eval for refractory resp symptoms of uncertain etiology.        Sandrea Hughs, MD 08/16/2023

## 2023-08-16 ENCOUNTER — Encounter: Payer: Self-pay | Admitting: Internal Medicine

## 2023-08-16 ENCOUNTER — Ambulatory Visit: Payer: Medicare Other | Admitting: Internal Medicine

## 2023-08-16 ENCOUNTER — Ambulatory Visit (HOSPITAL_COMMUNITY)
Admission: RE | Admit: 2023-08-16 | Discharge: 2023-08-16 | Disposition: A | Discharge status: Home / Self Care | Source: Ambulatory Visit | Attending: Internal Medicine | Admitting: Internal Medicine

## 2023-08-16 VITALS — BP 148/72 | HR 76 | Ht 69.5 in | Wt 198.0 lb

## 2023-08-16 DIAGNOSIS — R058 Other specified cough: Secondary | ICD-10-CM

## 2023-08-16 MED ORDER — PANTOPRAZOLE SODIUM 40 MG PO TBEC
40.0000 mg | DELAYED_RELEASE_TABLET | Freq: Every day | ORAL | 2 refills | Status: DC
Start: 2023-08-16 — End: 2023-12-03

## 2023-08-16 MED ORDER — FAMOTIDINE 20 MG PO TABS: ORAL_TABLET | ORAL | 11 refills | Status: DC

## 2023-08-16 MED ORDER — METHYLPREDNISOLONE ACETATE 80 MG/ML IJ SUSP
120.0000 mg | Freq: Once | INTRAMUSCULAR | Status: AC
  Administered 2023-08-16: 120 mg via INTRAMUSCULAR

## 2023-08-16 MED ORDER — BENZONATATE 200 MG PO CAPS: 200.0000 mg | ORAL_CAPSULE | Freq: Three times a day (TID) | ORAL | 1 refills | Status: DC | PRN

## 2023-08-16 NOTE — Patient Instructions (Addendum)
 Take delsym (otc)  two tsp every 12 hours and supplement if needed with  tessalon 200 mg every 6 hours   to suppress even the urge to cough. Swallowing water and/or using ice chips/non mint and menthol containing candies (such as lifesavers or sugarless jolly ranchers) are also effective.  You should rest your voice and avoid activities that you know make you cough.  Once you have eliminated the cough for 3 straight days try weaning  the  tessalon  off   Depomedrol 120 mg IM    Pantoprazole (protonix) 40 mg   Take  30-60 min before first meal of the day and Pepcid (famotidine)  20 mg after supper until return to office - this is the best way to tell whether stomach acid is contributing to your problem.     GERD (REFLUX)  is an extremely common cause of respiratory symptoms just like yours , many times with no obvious heartburn at all.    It can be treated with medication, but also with lifestyle changes including elevation of the head of your bed (ideally with 6 -8inch blocks under the headboard of your bed),  Smoking cessation, avoidance of late meals, excessive alcohol, and avoid fatty foods, chocolate, peppermint, colas, red wine, and acidic juices such as orange juice.  NO MINT OR MENTHOL PRODUCTS SO NO COUGH DROPS- LUDENs   USE SUGARLESS CANDY INSTEAD (Jolley ranchers or Stover's or Environmental manager) or even ice chips will also do - the key is to swallow to prevent all throat clearing. NO OIL BASED VITAMINS - use powdered substitutes.  Avoid fish oil when coughing.   Please remember to go to the  x-ray department  @  [facility] for your tests - we will call you with the results when they are available     Return in 3 weeks if not all better

## 2023-08-16 NOTE — Assessment & Plan Note (Signed)
 Onset xmas '24  - cyclical cough rx 08/16/2023 with tessalon 200 / depomedrol 120/  max gerd rx   Upper airway cough syndrome (previously labeled PNDS),  is so named because it's frequently impossible to sort out how much is  CR/sinusitis with freq throat clearing (which can be related to primary GERD)   vs  causing  secondary (" extra esophageal")  GERD from wide swings in gastric pressure that occur with throat clearing, often  promoting self use of mint and menthol lozenges that reduce the lower esophageal sphincter tone and exacerbate the problem further in a cyclical fashion.   These are the same pts (now being labeled as having "irritable larynx syndrome" by some cough centers) who not infrequently have a history of having failed to tolerate ace inhibitors,  dry powder inhalers or biphosphonates or report having atypical/extraesophageal (LPR) reflux symptoms that don't respond to standard doses of PPI  and are easily confused as having aecopd or asthma flares by even experienced allergists/ pulmonologists (myself included).   Of the three most common causes of  Sub-acute / recurrent or chronic cough, only one (GERD)  can actually contribute to/ trigger  the other two (asthma and post nasal drip syndrome)  and perpetuate the cylce of cough.  While not intuitively obvious, many patients with chronic low grade reflux do not cough until there is a primary insult that disturbs the protective epithelial barrier and exposes sensitive nerve endings.   This is typically viral but can due to PNDS and  either may apply here (not he had apparent vasomotor rhinitis dx by ent well before onset of cough)    The point is that once this occurs, it is difficult to eliminate the cycle  using anything but a maximally effective acid suppression regimen at least in the short run, accompanied by an appropriate diet to address non acid GERD and control / eliminate the cough itself for at least 3-5 days with tessalon 200 and  hard rock candy >>> also added depomedrol 120 mg IM  in case of component of Th-2 driven upper or lower airways inflammation (if cough responds short term only to relapse before return while will on full rx for uacs (as above), then that would point to allergic rhinitis/ asthma or eos bronchitis as alternative dx)    F/u in 3 weeks if not better, or with ENT locally.          Each maintenance medication was reviewed in detail including emphasizing most importantly the difference between maintenance and prns and under what circumstances the prns are to be triggered using an action plan format where appropriate.  Total time for H and P, chart review, counseling,  and generating customized AVS unique to this office visit / same day charting = 48 min new pt eval for refractory resp symptoms of uncertain etiology.

## 2023-08-23 ENCOUNTER — Telehealth: Payer: Self-pay

## 2023-08-23 NOTE — Telephone Encounter (Signed)
 Pt needs referral to [facility] Corey Harold DO they have changed insurance and provider is not in network and now they need a new referral.

## 2023-09-03 ENCOUNTER — Ambulatory Visit (HOSPITAL_COMMUNITY)
Admission: RE | Admit: 2023-09-03 | Discharge: 2023-09-03 | Disposition: A | Discharge status: Home / Self Care | Source: Ambulatory Visit | Attending: Family Medicine | Admitting: Family Medicine

## 2023-09-03 ENCOUNTER — Encounter: Payer: Self-pay | Admitting: Family Medicine

## 2023-09-03 ENCOUNTER — Ambulatory Visit (INDEPENDENT_AMBULATORY_CARE_PROVIDER_SITE_OTHER): Payer: Medicare HMO | Admitting: Family Medicine

## 2023-09-03 VITALS — BP 132/64 | HR 57 | Temp 98.1°F | Ht 69.5 in | Wt 193.0 lb

## 2023-09-03 DIAGNOSIS — Z130 Encounter for screening for diseases of the blood and blood-forming organs and certain disorders involving the immune mechanism: Secondary | ICD-10-CM

## 2023-09-03 DIAGNOSIS — L409 Psoriasis, unspecified: Secondary | ICD-10-CM | POA: Diagnosis not present

## 2023-09-03 DIAGNOSIS — E1159 Type 2 diabetes mellitus with other circulatory complications: Secondary | ICD-10-CM

## 2023-09-03 DIAGNOSIS — Z7984 Long term (current) use of oral hypoglycemic drugs: Secondary | ICD-10-CM | POA: Diagnosis not present

## 2023-09-03 DIAGNOSIS — K5900 Constipation, unspecified: Secondary | ICD-10-CM | POA: Diagnosis not present

## 2023-09-03 DIAGNOSIS — E782 Mixed hyperlipidemia: Secondary | ICD-10-CM | POA: Diagnosis not present

## 2023-09-03 DIAGNOSIS — I10 Essential (primary) hypertension: Secondary | ICD-10-CM | POA: Diagnosis not present

## 2023-09-03 MED ORDER — CLOBETASOL PROPIONATE 0.05 % EX CREA: 1.0000 | TOPICAL_CREAM | Freq: Two times a day (BID) | CUTANEOUS | 1 refills | Status: DC

## 2023-09-03 MED ORDER — IPRATROPIUM BROMIDE 0.06 % NA SOLN: 2.0000 | Freq: Three times a day (TID) | NASAL | 11 refills | Status: AC | PRN

## 2023-09-03 NOTE — Patient Instructions (Signed)
 Labs today.  Xray as well.  We will call with results.  Follow up in 3 months.

## 2023-09-04 LAB — LIPID PANEL: Chol/HDL Ratio: 4.2 ratio (ref 0.0–5.0)

## 2023-09-05 DIAGNOSIS — K5900 Constipation, unspecified: Secondary | ICD-10-CM | POA: Insufficient documentation

## 2023-09-05 NOTE — Assessment & Plan Note (Signed)
 Stable.  Continue atenolol .  Consider switch to ARB in the future.

## 2023-09-05 NOTE — Assessment & Plan Note (Signed)
 Be revealed mild constipation.  Benign abdominal exam.  MiraLAX 1-2 times daily.

## 2023-09-05 NOTE — Progress Notes (Signed)
 Subjective:  Patient ID: Joseph Burton, male    DOB: 1946-04-25  Age: 78 y.o. MRN: [id number]  CC:   Chief Complaint  Patient presents with   6 month follow up     Diabetes, cholesterol, just getting over cold saw Dr Waymond Hailey and was given tessalon , famotidine , pantoprazole . Request refills for psoriasis and runny nose    HPI:  78 year old male presents for follow-up.  Patient's blood pressure is well-controlled on atenolol .  Patient needs reassessment of type 2 diabetes.  He is currently on glipizide .  Patient needs foot exam.  Patient also needs urine ACR, and A1c.  States that his eye exam is up-to-date.  Additionally, patient notes that he has noted that his weight has decreased from his last visit, yet his pants feel tight.  He states that he feels bloated often.  He is concerned about this.  He does note that he has constipation.  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) 07/09/2020   Type 2 diabetes mellitus with other circulatory complication, without long-term current use of insulin (HCC) 07/09/2020    Social Hx   Social History   Socioeconomic History   Marital status: Married    Spouse name: Not on file   Number of children: Not on file   Years of education: Not on file   Highest education level: Not on file  Occupational History   Occupation: retired    Associate Professor: [employer]  Tobacco Use   Smoking status: Never   Smokeless tobacco: Never  Vaping Use   Vaping status: Never Used  Substance and Sexual Activity   Alcohol  use: No   Drug use: No    Sexual activity: Not on file  Other Topics Concern   Not on file  Social History Narrative   Not on file   Social Drivers of Health   Financial Resource Strain: Low Risk  (09/11/2022)   Overall Financial Resource Strain (CARDIA)    Difficulty of Paying Living Expenses: Not hard at all  Food Insecurity: No Food Insecurity (09/11/2022)   Hunger Vital Sign    Worried About Running Out of Food in the Last Year: Never true    Ran Out of Food in the Last Year: Never true  Transportation Needs: No Transportation Needs (09/11/2022)   PRAPARE - Administrator, Civil Service (Medical): No    Lack of Transportation (Non-Medical): No  Physical Activity: Sufficiently Active (09/11/2022)   Exercise Vital Sign    Days of Exercise per Week: 5 days    Minutes of Exercise per Session: 30 min  Stress: No Stress Concern Present (09/11/2022)   [employer] of Occupational Health - Occupational Stress Questionnaire    Feeling of Stress : Not at all  Social Connections: Unknown (01/09/2023)   Received from [facility]   Social Network    Social Network: Not on file    Review of Systems Per HPI  Objective:  BP 132/64   Pulse (!) 57   Temp 98.1 F (36.7 C)   Ht 5' 9.5" (1.765 m)   Wt 193 lb (87.5  kg)   SpO2 96%   BMI 28.09 kg/m      09/03/2023   10:43 AM 08/16/2023   11:01 AM 05/24/2023    3:30 PM  BP/Weight  Systolic BP 132 148 136  Diastolic BP 64 72 75  Wt. (Lbs) 193 198 198  BMI 28.09 kg/m2 28.82 kg/m2 28.82 kg/m2    Physical Exam Vitals and nursing note reviewed.  Constitutional:      General: He is not in acute distress.    Appearance: Normal appearance.  HENT:     Head: Normocephalic and atraumatic.  Cardiovascular:     Rate and Rhythm: Normal rate and regular rhythm.  Pulmonary:     Effort: Pulmonary effort is normal.     Breath sounds: Normal breath sounds.  Abdominal:     General: There is no distension.     Palpations: Abdomen is soft.      Tenderness: There is no abdominal tenderness.  Neurological:     Mental Status: He is alert.  Psychiatric:        Mood and Affect: Mood normal.        Behavior: Behavior normal.     Lab Results  Component Value Date   WBC 7.2 09/03/2023   HGB 15.9 09/03/2023   HCT 47.5 09/03/2023   PLT 177 09/03/2023   GLUCOSE 152 (H) 09/03/2023   CHOL 211 (H) 09/03/2023   TRIG WILL FOLLOW 09/03/2023   HDL 50 09/03/2023   LDLCALC WILL FOLLOW 09/03/2023   ALT 25 09/03/2023   AST 19 09/03/2023   NA 143 09/03/2023   K 4.1 09/03/2023   CL 101 09/03/2023   CREATININE 0.91 09/03/2023   BUN 16 09/03/2023   CO2 22 09/03/2023   INR 1.07 07/16/2010   HGBA1C 7.6 (H) 09/03/2023     Assessment & Plan:  Type 2 diabetes mellitus with other circulatory complication, without long-term current use of insulin (HCC) Assessment & Plan: Uncontrolled.  A1c returned at 7.6.  Continue glipizide .  Recommending addition of SGLT2 especially in the setting of mildly elevated urine ACR.  Orders: -     CMP14+EGFR -     Hemoglobin A1c -     Microalbumin / creatinine urine ratio  Mixed hyperlipidemia Assessment & Plan: Intolerant to statin therapy.  Awaiting final results of lipid panel.  Orders: -     Lipid panel  Screening for deficiency anemia -     CBC with Differential/Platelet  Constipation, unspecified constipation type Assessment & Plan: Be revealed mild constipation.  Benign abdominal exam.  MiraLAX 1-2 times daily.  Orders: -     DG Abd 1 View  Primary hypertension Assessment & Plan: Stable.  Continue atenolol .  Consider switch to ARB in the future.   Psoriasis Assessment & Plan: Clobetasol  refilled.   Other orders -     Ipratropium Bromide ; Place 2 sprays into both nostrils 3 (three) times daily as needed (NASAL CONGESTION/ALLERGIES.).  Dispense: 15 mL; Refill: 11 -     Clobetasol  Propionate; Apply 1 Application topically 2 (two) times daily.  Dispense: 60 g; Refill:  1    Follow-up:  3 months.  Kathleen Papa DO [facility]

## 2023-09-05 NOTE — Assessment & Plan Note (Signed)
 Intolerant to statin therapy.  Awaiting final results of lipid panel.

## 2023-09-05 NOTE — Assessment & Plan Note (Signed)
Clobetasol refilled.

## 2023-09-05 NOTE — Assessment & Plan Note (Signed)
 Uncontrolled.  A1c returned at 7.6.  Continue glipizide .  Recommending addition of SGLT2 especially in the setting of mildly elevated urine ACR.

## 2023-09-06 ENCOUNTER — Ambulatory Visit: Admitting: Internal Medicine

## 2023-09-07 LAB — CBC WITH DIFFERENTIAL/PLATELET
Basophils Absolute: 0 10*3/uL (ref 0.0–0.2)
Basos: 0 %
EOS (ABSOLUTE): 0.1 10*3/uL (ref 0.0–0.4)
Eos: 1 %
Hematocrit: 47.5 % (ref 37.5–51.0)
Hemoglobin: 15.9 g/dL (ref 13.0–17.7)
Immature Grans (Abs): 0.1 10*3/uL (ref 0.0–0.1)
Immature Granulocytes: 1 %
Lymphocytes Absolute: 1.4 10*3/uL (ref 0.7–3.1)
Lymphs: 20 %
MCH: 31.3 pg (ref 26.6–33.0)
MCHC: 33.5 g/dL (ref 31.5–35.7)
MCV: 94 fL (ref 79–97)
Monocytes Absolute: 0.5 10*3/uL (ref 0.1–0.9)
Monocytes: 7 %
Neutrophils Absolute: 5.1 10*3/uL (ref 1.4–7.0)
Neutrophils: 71 %
Platelets: 177 10*3/uL (ref 150–450)
RBC: 5.08 x10E6/uL (ref 4.14–5.80)
RDW: 13.1 % (ref 11.6–15.4)
WBC: 7.2 10*3/uL (ref 3.4–10.8)

## 2023-09-07 LAB — LIPID PANEL
Cholesterol, Total: 211 mg/dL — ABNORMAL HIGH (ref 100–199)
HDL: 50 mg/dL (ref >39)
LDL CALC COMMENT:: 4.2 ratio (ref 0.0–5.0)
LDL Chol Calc (NIH): 140 mg/dL — ABNORMAL HIGH (ref 0–99)
Triglycerides: 116 mg/dL (ref 0–149)
VLDL Cholesterol Cal: 21 mg/dL (ref 5–40)

## 2023-09-07 LAB — CMP14+EGFR
ALT: 25 IU/L (ref 0–44)
AST: 19 IU/L (ref 0–40)
Albumin: 4.8 g/dL (ref 3.8–4.8)
Alkaline Phosphatase: 94 IU/L (ref 44–121)
BUN/Creatinine Ratio: 18 (ref 10–24)
BUN: 16 mg/dL (ref 8–27)
Bilirubin Total: 0.5 mg/dL (ref 0.0–1.2)
CO2: 22 mmol/L (ref 20–29)
Calcium: 9.7 mg/dL (ref 8.6–10.2)
Chloride: 101 mmol/L (ref 96–106)
Creatinine, Ser: 0.91 mg/dL (ref 0.76–1.27)
Globulin, Total: 2.5 g/dL (ref 1.5–4.5)
Glucose: 152 mg/dL — ABNORMAL HIGH (ref 70–99)
Potassium: 4.1 mmol/L (ref 3.5–5.2)
Sodium: 143 mmol/L (ref 134–144)
Total Protein: 7.3 g/dL (ref 6.0–8.5)
eGFR: 87 mL/min/{1.73_m2} (ref >59)

## 2023-09-07 LAB — HEMOGLOBIN A1C
Est. average glucose Bld gHb Est-mCnc: 171 mg/dL
Hgb A1c MFr Bld: 7.6 % — ABNORMAL HIGH (ref 4.8–5.6)

## 2023-09-07 LAB — MICROALBUMIN / CREATININE URINE RATIO
Creatinine, Urine: 135.8 mg/dL
Microalb/Creat Ratio: 47 mg/g{creat} — ABNORMAL HIGH (ref 0–29)
Microalbumin, Urine: 63.9 ug/mL

## 2023-09-17 ENCOUNTER — Ambulatory Visit (INDEPENDENT_AMBULATORY_CARE_PROVIDER_SITE_OTHER): Payer: Medicare HMO

## 2023-09-17 VITALS — Ht 69.5 in | Wt 193.0 lb

## 2023-09-17 DIAGNOSIS — Z0000 Encounter for general adult medical examination without abnormal findings: Secondary | ICD-10-CM

## 2023-09-17 NOTE — Patient Instructions (Signed)
 Joseph Burton , Thank you for taking time to come for your Medicare Wellness Visit. I appreciate your ongoing commitment to your health goals. Please review the following plan we discussed and let me know if I can assist you in the future.   Referrals/Orders/Follow-Ups/Clinician Recommendations: Aim for 30 minutes of exercise or brisk walking, 6-8 glasses of water , and 5 servings of fruits and vegetables each day.  This is a list of the screening recommended for you and due dates:  Health Maintenance  Topic Date Due   Zoster (Shingles) Vaccine (1 of 2) Never done   COVID-19 Vaccine (2 - Moderna risk series) 02/14/2019   Eye exam for diabetics  02/17/2023   Pneumonia Vaccine (2 of 2 - PCV) 09/04/2024*   Flu Shot  12/17/2023   Hemoglobin A1C  03/04/2024   Yearly kidney function blood test for diabetes  09/02/2024   Yearly kidney health urinalysis for diabetes  09/02/2024   Complete foot exam   09/02/2024   Medicare Annual Wellness Visit  09/16/2024   DTaP/Tdap/Td vaccine (2 - Td or Tdap) 02/25/2027   HPV Vaccine  Aged Out   Meningitis B Vaccine  Aged Out   Colon Cancer Screening  Discontinued   Hepatitis C Screening  Discontinued  *Topic was postponed. The date shown is not the original due date.    Advanced directives: (ACP Link)Information on Advanced Care Planning can be found at [place]  Secretary of [facility] Advance Health Care Directives Advance Health Care Directives. [email or web address]   Next Medicare Annual Wellness Visit scheduled for next year: Yes  Have you seen your provider in the last 6 months (3 months if uncontrolled diabetes)? Yes

## 2023-09-17 NOTE — Progress Notes (Signed)
 Subjective:   Joseph Burton is a 78 y.o. who presents for a Medicare Wellness preventive visit.  Visit Complete: Virtual I connected with  Joseph Burton on 09/17/23 by a audio enabled telemedicine application and verified that I am speaking with the correct person using two identifiers.  Patient Location: Home  Provider Location: Home Office  I discussed the limitations of evaluation and management by telemedicine. The patient expressed understanding and agreed to proceed.  Vital Signs: Because this visit was a virtual/telehealth visit, some criteria may be missing or patient reported. Any vitals not documented were not able to be obtained and vitals that have been documented are patient reported.  VideoDeclined- This patient declined Librarian, academic. Therefore the visit was completed with audio only.  Persons Participating in Visit: Patient.  AWV Questionnaire: No: Patient Medicare AWV questionnaire was not completed prior to this visit.  Cardiac Risk Factors include: advanced age (>46men, >64 women);diabetes mellitus;hypertension;male gender     Objective:    Today's Vitals   09/17/23 1425  Weight: 193 lb (87.5 kg)  Height: 5' 9.5" (1.765 m)   Body mass index is 28.09 kg/m.     09/17/2023    2:34 PM 09/11/2022   11:37 AM 11/14/2020    9:20 AM 03/28/2018   12:06 PM 05/29/2016    9:11 AM 05/25/2016   11:18 AM 03/05/2016   10:23 AM  Advanced Directives  Does Patient Have a Medical Advance Directive? No No No No No No No  Would patient like information on creating a medical advance directive? Yes (MAU/Ambulatory/Procedural Areas - Information given) Yes (MAU/Ambulatory/Procedural Areas - Information given) No - Patient declined No - Patient declined  Yes (MAU/Ambulatory/Procedural Areas - Information given) No - patient declined information    Current Medications (verified) Outpatient Encounter Medications as of 09/17/2023   Medication Sig   aspirin  EC 81 MG tablet Take 81 mg by mouth every other day. Swallow whole.   atenolol  (TENORMIN ) 25 MG tablet TAKE 1 TABLET BY MOUTH AT BEDTIME   atorvastatin  (LIPITOR) 20 MG tablet Take 1 tablet (20 mg total) by mouth daily.   benzonatate  (TESSALON ) 200 MG capsule Take 1 capsule (200 mg total) by mouth 3 (three) times daily as needed for cough.   calcium  carbonate (OS-CAL) 600 MG TABS tablet Take 600 mg by mouth 2 (two) times daily with a meal.   carboxymethylcellulose (REFRESH PLUS) 0.5 % SOLN Place 1 drop into both eyes 3 (three) times daily as needed (for dry/irritated/watery eyes.).   CINNAMON PO Take 1,000 mg by mouth in the morning and at bedtime.   clobetasol  cream (TEMOVATE ) 0.05 % Apply 1 Application topically 2 (two) times daily.   Cyanocobalamin (VITAMIN B-12) 2500 MCG SUBL Take 2,500 mcg by mouth in the morning.   diclofenac  Sodium (VOLTAREN  ARTHRITIS PAIN) 1 % GEL Apply 2 g topically 4 (four) times daily.   famotidine  (PEPCID ) 20 MG tablet One after supper   Flaxseed, Linseed, (FLAX SEED OIL) 1000 MG CAPS Take by mouth.   folic acid (FOLVITE) 400 MCG tablet Take 400 mcg by mouth in the morning.   glipiZIDE  (GLUCOTROL ) 5 MG tablet Take 1 tablet (5 mg total) by mouth 2 (two) times daily before a meal.   ibuprofen  (ADVIL ) 600 MG tablet Take 1 tablet (600 mg total) by mouth every 8 (eight) hours as needed.   ipratropium (ATROVENT ) 0.06 % nasal spray Place 2 sprays into both nostrils 3 (three) times daily as needed (  NASAL CONGESTION/ALLERGIES.).   naproxen  (NAPROSYN ) 500 MG tablet Take 1 tablet (500 mg total) by mouth 2 (two) times daily as needed for moderate pain or mild pain.   nitroGLYCERIN  (NITROSTAT ) 0.4 MG SL tablet Place 1 tablet (0.4 mg total) under the tongue every 5 (five) minutes x 3 doses as needed for chest pain (if no relief after 2nd dose, proceed to ED or call 911).   pantoprazole  (PROTONIX ) 40 MG tablet Take 1 tablet (40 mg total) by mouth daily. Take  30-60 min before first meal of the day   Potassium 99 MG TABS Take by mouth.   pyridOXINE (VITAMIN B-6) 100 MG tablet Take 100 mg by mouth in the morning.   Red Yeast Rice Extract 600 MG CAPS Take 600 mg by mouth every evening.   tamsulosin  (FLOMAX ) 0.4 MG CAPS capsule Take 2 capsules (0.8 mg total) by mouth daily.   Zinc 50 MG TABS Take 50 mg by mouth in the morning.   No facility-administered encounter medications on file as of 09/17/2023.    Allergies (verified) No known allergies   History: Past Medical History:  Diagnosis Date   Arthritis    Bloating 03/16/2018   Coronary artery disease    Multivessel status post CABG 2002   Early cataracts, bilateral    Environmental and seasonal allergies    Fatty liver    GERD (gastroesophageal reflux disease)    Hearing loss    Hypertension    Sleep apnea    Type 2 diabetes mellitus (HCC)    Umbilical hernia    Past Surgical History:  Procedure Laterality Date   CARDIAC CATHETERIZATION     2002   CHOLECYSTECTOMY     3 years ago   COLONOSCOPY N/A 10/02/2015   Procedure: COLONOSCOPY;  Surgeon: Ruby Corporal, MD;  [facility];  Service: Endoscopy;  Laterality: N/A;  930   COLONOSCOPY N/A 11/14/2020   Procedure: COLONOSCOPY;  Surgeon: Ruby Corporal, MD;  [facility]: [facility];  Service: Endoscopy;  Laterality: N/A;  1030   CORONARY ARTERY BYPASS GRAFT  2002   2002   ESOPHAGOGASTRODUODENOSCOPY N/A 03/28/2018   Procedure: ESOPHAGOGASTRODUODENOSCOPY (EGD);  Surgeon: Ruby Corporal, MD;  [facility]: [facility];  Service: Endoscopy;  Laterality: N/A;  2:20   INSERTION OF MESH N/A 05/29/2016   Procedure: INSERTION OF MESH;  Surgeon: Shela Derby, MD;  Location: [facility];  Service: General;  Laterality: N/A;   JOINT REPLACEMENT     Left shoulder   POLYPECTOMY  11/14/2020   Procedure: POLYPECTOMY;  Surgeon: Ruby Corporal, MD;  Location: [facility];  Service: Endoscopy;;   SHOULDER ARTHROSCOPY W/ ROTATOR CUFF REPAIR  Left 07/2010   TONSILLECTOMY  1969   TREATMENT FISTULA ANAL     UMBILICAL HERNIA REPAIR N/A 05/29/2016   Procedure: LAPAROSCOPIC UMBILICAL HERNIA;  Surgeon: Shela Derby, MD;  Location: [facility] OR;  Service: General;  Laterality: N/A;   Family History  Problem Relation Age of Onset   Alzheimer's disease Mother    Colon cancer Father    Heart disease Father    Hypertension Father    Melanoma Father    Prostate cancer Father    Heart disease Sister    Heart disease Brother    Social History   Socioeconomic History   Marital status: Married    Spouse name: Not on file   Number of children: Not on file   Years of education: Not on file  Highest education level: Not on file  Occupational History   Occupation: retired    Associate Professor: [employer]  Tobacco Use   Smoking status: Never   Smokeless tobacco: Never  Vaping Use   Vaping status: Never Used  Substance and Sexual Activity   Alcohol  use: No   Drug use: No   Sexual activity: Not on file  Other Topics Concern   Not on file  Social History Narrative   Not on file   Social Drivers of Health   Financial Resource Strain: Low Risk  (09/17/2023)   Overall Financial Resource Strain (CARDIA)    Difficulty of Paying Living Expenses: Not hard at all  Food Insecurity: No Food Insecurity (09/17/2023)   Hunger Vital Sign    Worried About Running Out of Food in the Last Year: Never true    Ran Out of Food in the Last Year: Never true  Transportation Needs: No Transportation Needs (09/17/2023)   PRAPARE - Administrator, Civil Service (Medical): No    Lack of Transportation (Non-Medical): No  Physical Activity: Sufficiently Active (09/17/2023)   Exercise Vital Sign    Days of Exercise per Week: 5 days    Minutes of Exercise per Session: 30 min  Stress: No Stress Concern Present (09/17/2023)   [employer] of Occupational Health - Occupational Stress Questionnaire    Feeling of Stress : Not at all  Social Connections:  Socially Integrated (09/17/2023)   Social Connection and Isolation Panel [NHANES]    Frequency of Communication with Friends and Family: More than three times a week    Frequency of Social Gatherings with Friends and Family: Three times a week    Attends Religious Services: More than 4 times per year    Active Member of Clubs or Organizations: Yes    Attends Engineer, structural: More than 4 times per year    Marital Status: Married    Tobacco Counseling Counseling given: Not Answered    Clinical Intake:  Pre-visit preparation completed: Yes  Pain : No/denies pain     Diabetes: Yes CBG done?: No Did pt. bring in CBG monitor from home?: No  Lab Results  Component Value Date   HGBA1C 7.6 (H) 09/03/2023   HGBA1C 7.9 (H) 12/24/2022   HGBA1C 7.7 (H) 06/25/2022     How often do you need to have someone help you when you read instructions, pamphlets, or other written materials from your doctor or pharmacy?: 1 - Never  Interpreter Needed?: No  Information entered by :: Seabron Cypress LPN   Activities of Daily Living     09/17/2023    2:28 PM  In your present state of health, do you have any difficulty performing the following activities:  Hearing? 0  Vision? 0  Difficulty concentrating or making decisions? 0  Walking or climbing stairs? 0  Dressing or bathing? 0  Doing errands, shopping? 0  Preparing Food and eating ? N  Using the Toilet? N  In the past six months, have you accidently leaked urine? N  Do you have problems with loss of bowel control? N  Managing your Medications? N  Managing your Finances? N  Housekeeping or managing your Housekeeping? N    Patient Care Team: Cook, Jayce G, DO as PCP - General (Family Medicine) Gerard Knight, MD as PCP - Cardiology (Cardiology) Royal Cordon, MD as Referring Physician (Dermatology) Pa, [facility] Waymond Hailey Nadene Aures, MD as Consulting Physician (Pulmonary Disease)  Indicate any  recent  Medical Services you may have received from other than [facility] providers in the past year (date may be approximate).     Assessment:   This is a routine wellness examination for Joseph Burton.  Hearing/Vision screen Hearing Screening - Comments:: Denies hearing difficulties    Vision Screening - Comments:: Wears rx glasses - up to date with routine eye exams with [facility]    Goals Addressed             This Visit's Progress    Remain active and independent   On track      Depression Screen     09/17/2023    2:30 PM 05/24/2023    3:44 PM 03/05/2023   11:06 AM 01/28/2023   10:38 AM 09/11/2022   11:46 AM 06/25/2022    9:13 AM  PHQ 2/9 Scores  PHQ - 2 Score 0 0 0 0 0 0  PHQ- 9 Score  1 2 2   0    Fall Risk     09/17/2023    2:36 PM 03/05/2023   11:06 AM 01/28/2023   10:38 AM 09/11/2022   11:46 AM 06/25/2022    9:13 AM  Fall Risk   Falls in the past year? 0 0 0 0 0  Number falls in past yr: 0   0 0  Injury with Fall? 0   0 0  Risk for fall due to : No Fall Risks   No Fall Risks No Fall Risks  Follow up Falls prevention discussed;Education provided;Falls evaluation completed   Falls prevention discussed;Education provided;Falls evaluation completed Falls evaluation completed    MEDICARE RISK AT HOME:  Medicare Risk at Home Any stairs in or around the home?: No If so, are there any without handrails?: No Home free of loose throw rugs in walkways, pet beds, electrical cords, etc?: Yes Adequate lighting in your home to reduce risk of falls?: Yes Life alert?: No Use of a cane, walker or w/c?: No Grab bars in the bathroom?: Yes Shower chair or bench in shower?: No Elevated toilet seat or a handicapped toilet?: Yes  TIMED UP AND GO:  Was the test performed?  No  Cognitive Function: 6CIT completed        09/17/2023    2:36 PM 09/11/2022   11:37 AM  6CIT Screen  What Year? 0 points 0 points  What month? 0 points 0 points  What time? 0 points 0 points  Count back from 20 0  points 0 points  Months in reverse 0 points 0 points  Repeat phrase 0 points 0 points  Total Score 0 points 0 points    Immunizations Immunization History  Administered Date(s) Administered   Moderna Sars-Covid-2 Vaccination 01/17/2019   Pneumococcal Polysaccharide-23 02/14/2014   Tdap 02/24/2017    Screening Tests Health Maintenance  Topic Date Due   Zoster Vaccines- Shingrix (1 of 2) Never done   COVID-19 Vaccine (2 - Moderna risk series) 02/14/2019   OPHTHALMOLOGY EXAM  02/17/2023   Pneumonia Vaccine 105+ Years old (2 of 2 - PCV) 09/04/2024 (Originally 02/15/2015)   INFLUENZA VACCINE  12/17/2023   HEMOGLOBIN A1C  03/04/2024   Diabetic kidney evaluation - eGFR measurement  09/02/2024   Diabetic kidney evaluation - Urine ACR  09/02/2024   FOOT EXAM  09/02/2024   Medicare Annual Wellness (AWV)  09/16/2024   DTaP/Tdap/Td (2 - Td or Tdap) 02/25/2027   HPV VACCINES  Aged Out   Meningococcal B Vaccine  Aged Out  Colonoscopy  Discontinued   Hepatitis C Screening  Discontinued    Health Maintenance  Health Maintenance Due  Topic Date Due   Zoster Vaccines- Shingrix (1 of 2) Never done   COVID-19 Vaccine (2 - Moderna risk series) 02/14/2019   OPHTHALMOLOGY EXAM  02/17/2023   Health Maintenance Items Addressed: Records requested for diabetic eye exam   Additional Screening:  Vision Screening: Recommended annual ophthalmology exams for early detection of glaucoma and other disorders of the eye.  Dental Screening: Recommended annual dental exams for proper oral hygiene  Community Resource Referral / Chronic Care Management: CRR required this visit?  No   CCM required this visit?  No     Plan:     I have personally reviewed and noted the following in the patient's chart:   Medical and social history Use of alcohol , tobacco or illicit drugs  Current medications and supplements including opioid prescriptions. Patient is not currently taking opioid  prescriptions. Functional ability and status Nutritional status Physical activity Advanced directives List of other physicians Hospitalizations, surgeries, and ER visits in previous 12 months Vitals Screenings to include cognitive, depression, and falls Referrals and appointments  In addition, I have reviewed and discussed with patient certain preventive protocols, quality metrics, and best practice recommendations. A written personalized care plan for preventive services as well as general preventive health recommendations were provided to patient.     Seabron Cypress [place], [place]   05/20/2438   After Visit Summary: (MyChart) Due to this being a telephonic visit, the after visit summary with patients personalized plan was offered to patient via MyChart   Notes: Nothing significant to report at this time.

## 2023-10-20 DIAGNOSIS — L814 Other melanin hyperpigmentation: Secondary | ICD-10-CM | POA: Diagnosis not present

## 2023-10-20 DIAGNOSIS — L570 Actinic keratosis: Secondary | ICD-10-CM | POA: Diagnosis not present

## 2023-10-20 DIAGNOSIS — L400 Psoriasis vulgaris: Secondary | ICD-10-CM | POA: Diagnosis not present

## 2023-10-20 DIAGNOSIS — L821 Other seborrheic keratosis: Secondary | ICD-10-CM | POA: Diagnosis not present

## 2023-11-01 ENCOUNTER — Other Ambulatory Visit: Payer: Self-pay | Admitting: Family Medicine

## 2023-11-01 ENCOUNTER — Ambulatory Visit: Payer: Self-pay

## 2023-11-01 MED ORDER — LIDOCAINE 5 % EX PTCH
1.0000 | MEDICATED_PATCH | CUTANEOUS | 0 refills | Status: DC
Start: 2023-11-01 — End: 2023-12-03

## 2023-11-01 NOTE — Telephone Encounter (Signed)
 FYI Only or Action Required?: Action required by provider  Patient was last seen in primary care on 09/03/2023 by Cook, Jayce G, DO. Called Nurse Triage reporting Back Pain. Symptoms began last Friday. Interventions attempted: OTC medications: Tylenol  . Symptoms are: unchanged.  Triage Disposition: Home Care  Patient/caregiver understands and will follow disposition?: No, wishes to speak with PCP  Wife would like Lidocaine  patch RX written.           Copied from CRM [id number]. Topic: Clinical - Red Word Triage >> Nov 01, 2023  1:10 PM Elle L wrote: Red Word that prompted transfer to Nurse Triage: The patient's wife called to schedule the patient an appointment. However, she advised me that the patient is needing lidocaine  patches as the patient strained his back and is experiencing back pain. Reason for Disposition  Caused by overuse from recent vigorous activity (e.g., exercise, gardening, lifting and carrying, sports)  Answer Assessment - Initial Assessment Questions 1. ONSET: When did the pain begin?      Last Friday  2. LOCATION: Where does it hurt? (upper, mid or lower back)     Pt. Was cutting grass and strained his lower back.  3. SEVERITY: How bad is the pain?  (e.g., Scale 1-10; mild, moderate, or severe)   - MILD (1-3): Doesn't interfere with normal activities.    - MODERATE (4-7): Interferes with normal activities or awakens from sleep.    - SEVERE (8-10): Excruciating pain, unable to do any normal activities.      Mild pain 4. PATTERN: Is the pain constant? (e.g., yes, no; constant, intermittent)      Constant depending on activity.  5. RADIATION: Does the pain shoot into your legs or somewhere else?     No  6. CAUSE:  What do you think is causing the back pain?      Mowing lawn last Friday  7. BACK OVERUSE:  Any recent lifting of heavy objects, strenuous work or exercise?     Yes 8. MEDICINES: What have you taken so far for the pain? (e.g., nothing,  acetaminophen , NSAIDS)     Tylenol  seems to be effective somewhat.  9. NEUROLOGIC SYMPTOMS: Do you have any weakness, numbness, or problems with bowel/bladder control?     No  10. OTHER SYMPTOMS: Do you have any other symptoms? (e.g., fever, abdomen pain, burning with urination, blood in urine)       No  Pts. Wife would like a prescription for Lidocaine  patches written for the patient.  Protocols used: Back Pain-A-AH

## 2023-11-03 ENCOUNTER — Other Ambulatory Visit (HOSPITAL_COMMUNITY): Payer: Self-pay

## 2023-11-03 ENCOUNTER — Telehealth: Payer: Self-pay | Admitting: Pharmacy Technician

## 2023-11-03 NOTE — Telephone Encounter (Signed)
 Pharmacy Patient Advocate Encounter   Received notification from CoverMyMeds that prior authorization for Lidocaine  5% patches is required/requested.   Insurance verification completed.   The patient is insured through [facility] [employer]/RX ADV[employer] .   Per test claim: PA will only be approved if treating pain associated with post-herpetic neuralgia. They have the option of paying out of pocket for the 5% or purchasing 4% OTC.

## 2023-11-10 NOTE — Telephone Encounter (Signed)
 Patient notified

## 2023-11-15 ENCOUNTER — Other Ambulatory Visit: Payer: Self-pay | Admitting: Internal Medicine

## 2023-11-15 DIAGNOSIS — R058 Other specified cough: Secondary | ICD-10-CM

## 2023-12-03 ENCOUNTER — Encounter: Payer: Self-pay | Admitting: Family Medicine

## 2023-12-03 ENCOUNTER — Ambulatory Visit: Admitting: Family Medicine

## 2023-12-03 VITALS — BP 106/62 | HR 72 | Temp 97.9°F | Ht 69.5 in | Wt 192.0 lb

## 2023-12-03 DIAGNOSIS — R066 Hiccough: Secondary | ICD-10-CM | POA: Diagnosis not present

## 2023-12-03 DIAGNOSIS — R058 Other specified cough: Secondary | ICD-10-CM

## 2023-12-03 MED ORDER — BENZONATATE 200 MG PO CAPS: 200.0000 mg | ORAL_CAPSULE | Freq: Three times a day (TID) | ORAL | 1 refills | Status: AC | PRN

## 2023-12-03 MED ORDER — PANTOPRAZOLE SODIUM 40 MG PO TBEC: 40.0000 mg | DELAYED_RELEASE_TABLET | Freq: Every day | ORAL | 3 refills | Status: AC

## 2023-12-03 NOTE — Patient Instructions (Signed)
 Medications sent in.  Follow up in 3 months.

## 2023-12-08 DIAGNOSIS — R066 Hiccough: Secondary | ICD-10-CM | POA: Insufficient documentation

## 2023-12-08 NOTE — Assessment & Plan Note (Signed)
 Meds refilled.

## 2023-12-08 NOTE — Assessment & Plan Note (Signed)
 Likely secondary to GERD. Placing back on Protonix .

## 2023-12-08 NOTE — Progress Notes (Signed)
 Subjective:  Patient ID: Joseph Burton, male    DOB: [birth year]  Age: 78 y.o. MRN: [id number]  CC:   Chief Complaint  Patient presents with   3 month folllow up     Pt would like to have pcp take over Dr Chari meds protonix , tessalon  pearls, and famotidine  He wasn't sure these helped much and did not refill after the bottle ran out   Hiccups    Every other day multiple ties per day  Reports tightness in chest     HPI:  78 year old male presents for follow up.  Patient experiencing frequent hiccups. Has run out of Protonix , Pepcid , and Tessalon  perles for Upper Airway Cough Syndrome. Requesting medication refills today.  Has recently been experiencing dizziness. Responds to wife's medication.   Also reports bloating. No significant abdominal pain.   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) 07/09/2020   Type 2 diabetes mellitus with other circulatory complication, without long-term current use of insulin (HCC) 07/09/2020    Social Hx   Social History   Socioeconomic History   Marital status: Married    Spouse name: Not on file   Number of children: Not on file   Years of education: Not on file   Highest education level: Not on file  Occupational History   Occupation: retired    Associate Professor: [employer]  Tobacco Use   Smoking status: Never   Smokeless tobacco: Never  Vaping Use   Vaping status: Never Used  Substance and Sexual Activity   Alcohol  use: No   Drug use: No   Sexual activity: Not on file  Other Topics Concern   Not on  file  Social History Narrative   Not on file   Social Drivers of Health   Financial Resource Strain: Low Risk  (09/17/2023)   Overall Financial Resource Strain (CARDIA)    Difficulty of Paying Living Expenses: Not hard at all  Food Insecurity: No Food Insecurity (09/17/2023)   Hunger Vital Sign    Worried About Running Out of Food in the Last Year: Never true    Ran Out of Food in the Last Year: Never true  Transportation Needs: No Transportation Needs (09/17/2023)   PRAPARE - Administrator, Civil Service (Medical): No    Lack of Transportation (Non-Medical): No  Physical Activity: Sufficiently Active (09/17/2023)   Exercise Vital Sign    Days of Exercise per Week: 5 days    Minutes of Exercise per Session: 30 min  Stress: No Stress Concern Present (09/17/2023)   [employer] of Occupational Health - Occupational Stress Questionnaire    Feeling of Stress : Not at all  Social Connections: Socially Integrated (09/17/2023)   Social Connection and Isolation Panel    Frequency of Communication with Friends and Family: More than three times a week    Frequency of Social Gatherings with Friends and Family: Three times a week    Attends Religious Services: More than 4 times per year    Active Member of Clubs or Organizations: Yes    Attends  Engineer, structural: More than 4 times per year    Marital Status: Married    Review of Systems Per HPI  Objective:  BP 106/62   Pulse 72   Temp 97.9 F (36.6 C)   Ht 5' 9.5 (1.765 m)   Wt 192 lb (87.1 kg)   SpO2 97%   BMI 27.95 kg/m      12/03/2023   11:11 AM 09/17/2023    2:25 PM 09/03/2023   10:43 AM  BP/Weight  Systolic BP 106 -- 132  Diastolic BP 62 -- 64  Wt. (Lbs) 192 193 193  BMI 27.95 kg/m2 28.09 kg/m2 28.09 kg/m2    Physical Exam Constitutional:      General: He is not in acute distress.    Appearance: Normal appearance.  HENT:     Head: Normocephalic and atraumatic.  Cardiovascular:     Rate and  Rhythm: Normal rate and regular rhythm.  Pulmonary:     Effort: Pulmonary effort is normal.     Breath sounds: Normal breath sounds. No wheezing or rales.  Neurological:     Mental Status: He is alert.     Lab Results  Component Value Date   WBC 7.2 09/03/2023   HGB 15.9 09/03/2023   HCT 47.5 09/03/2023   PLT 177 09/03/2023   GLUCOSE 152 (H) 09/03/2023   CHOL 211 (H) 09/03/2023   TRIG 116 09/03/2023   HDL 50 09/03/2023   LDLCALC 140 (H) 09/03/2023   ALT 25 09/03/2023   AST 19 09/03/2023   NA 143 09/03/2023   K 4.1 09/03/2023   CL 101 09/03/2023   CREATININE 0.91 09/03/2023   BUN 16 09/03/2023   CO2 22 09/03/2023   INR 1.07 07/16/2010   HGBA1C 7.6 (H) 09/03/2023     Assessment & Plan:  Hiccups Assessment & Plan: Likely secondary to GERD. Placing back on Protonix .   Upper airway cough syndrome Assessment & Plan: Meds refilled.  Orders: -     Pantoprazole  Sodium; Take 1 tablet (40 mg total) by mouth daily.  Dispense: 90 tablet; Refill: 3 -     Benzonatate ; Take 1 capsule (200 mg total) by mouth 3 (three) times daily as needed for cough.  Dispense: 90 capsule; Refill: 1    Follow-up:  3 months  Rmani Kellogg Bluford DO [facility]

## 2023-12-24 DIAGNOSIS — E119 Type 2 diabetes mellitus without complications: Secondary | ICD-10-CM | POA: Diagnosis not present

## 2023-12-24 DIAGNOSIS — H25813 Combined forms of age-related cataract, bilateral: Secondary | ICD-10-CM | POA: Diagnosis not present

## 2023-12-24 DIAGNOSIS — H524 Presbyopia: Secondary | ICD-10-CM | POA: Diagnosis not present

## 2023-12-24 LAB — HM DIABETES EYE EXAM

## 2024-01-24 ENCOUNTER — Other Ambulatory Visit: Payer: Self-pay | Admitting: Family Medicine

## 2024-01-27 ENCOUNTER — Other Ambulatory Visit: Payer: Self-pay | Admitting: Family Medicine

## 2024-03-03 ENCOUNTER — Ambulatory Visit (INDEPENDENT_AMBULATORY_CARE_PROVIDER_SITE_OTHER): Admitting: Family Medicine

## 2024-03-03 ENCOUNTER — Encounter: Payer: Self-pay | Admitting: Family Medicine

## 2024-03-03 VITALS — BP 128/72 | HR 70 | Ht 68.5 in | Wt 191.0 lb

## 2024-03-03 DIAGNOSIS — R200 Anesthesia of skin: Secondary | ICD-10-CM

## 2024-03-03 DIAGNOSIS — E782 Mixed hyperlipidemia: Secondary | ICD-10-CM | POA: Diagnosis not present

## 2024-03-03 DIAGNOSIS — Z7984 Long term (current) use of oral hypoglycemic drugs: Secondary | ICD-10-CM

## 2024-03-03 DIAGNOSIS — E1159 Type 2 diabetes mellitus with other circulatory complications: Secondary | ICD-10-CM | POA: Diagnosis not present

## 2024-03-03 DIAGNOSIS — R202 Paresthesia of skin: Secondary | ICD-10-CM

## 2024-03-03 NOTE — Patient Instructions (Signed)
 Labs today.  Let me know if you continue to have trouble with the hand.  Follow up in 3-6 months.

## 2024-03-05 ENCOUNTER — Ambulatory Visit: Payer: Self-pay | Admitting: Family Medicine

## 2024-03-05 DIAGNOSIS — R202 Paresthesia of skin: Secondary | ICD-10-CM | POA: Insufficient documentation

## 2024-03-05 LAB — CMP14+EGFR
ALT: 21 IU/L (ref 0–44)
AST: 20 IU/L (ref 0–40)
Albumin: 4.5 g/dL (ref 3.8–4.8)
Alkaline Phosphatase: 85 IU/L (ref 47–123)
BUN/Creatinine Ratio: 15 (ref 10–24)
BUN: 15 mg/dL (ref 8–27)
Bilirubin Total: 0.6 mg/dL (ref 0.0–1.2)
CO2: 19 mmol/L — ABNORMAL LOW (ref 20–29)
Calcium: 9.6 mg/dL (ref 8.6–10.2)
Chloride: 104 mmol/L (ref 96–106)
Creatinine, Ser: 1.02 mg/dL (ref 0.76–1.27)
Globulin, Total: 2.3 g/dL (ref 1.5–4.5)
Glucose: 186 mg/dL — ABNORMAL HIGH (ref 70–99)
Potassium: 4.8 mmol/L (ref 3.5–5.2)
Sodium: 140 mmol/L (ref 134–144)
Total Protein: 6.8 g/dL (ref 6.0–8.5)
eGFR: 75 mL/min/1.73 (ref >59)

## 2024-03-05 LAB — HEMOGLOBIN A1C
Est. average glucose Bld gHb Est-mCnc: 154 mg/dL
Hgb A1c MFr Bld: 7 % — ABNORMAL HIGH (ref 4.8–5.6)

## 2024-03-05 LAB — MICROALBUMIN / CREATININE URINE RATIO
Creatinine, Urine: 150.7 mg/dL
Microalb/Creat Ratio: 40 mg/g{creat} — AB (ref 0–29)
Microalbumin, Urine: 60.1 ug/mL

## 2024-03-05 LAB — LIPID PANEL
Chol/HDL Ratio: 4.7 ratio (ref 0.0–5.0)
Cholesterol, Total: 196 mg/dL (ref 100–199)
HDL: 42 mg/dL (ref >39)
LDL Chol Calc (NIH): 128 mg/dL — ABNORMAL HIGH (ref 0–99)
Triglycerides: 144 mg/dL (ref 0–149)
VLDL Cholesterol Cal: 26 mg/dL (ref 5–40)

## 2024-03-05 NOTE — Progress Notes (Signed)
 Subjective:  Patient ID: Joseph Burton, male    DOB: May 08, 1946  Age: 78 y.o. MRN: [id number]  CC:   Chief Complaint  Patient presents with   Follow-up    2 months f/u Left hand tingling/numbness-2 weeks    HPI:  78 year old male presents for follow up.  Patient reports left hand numbness and tingling past 2 weeks.  He states that this starts from the elbow down.  No reports of neck pain.  Doubt cervical radiculopathy.  Patient states that this is mildly troublesome.  He would like to discuss this today.  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 08/16/2023   Psoriasis 03/07/2023   BPH (benign prostatic hyperplasia) 01/28/2023   Osteoarthritis 10/10/2022   Primary hypertension 07/09/2020   OSA on CPAP 07/09/2020   Mixed hyperlipidemia 07/09/2020   Coronary artery disease involving native coronary artery of native heart with angina pectoris 07/09/2020   Type 2 diabetes mellitus with other circulatory complication, without long-term current use of insulin (HCC) 07/09/2020    Social Hx   Social History   Socioeconomic History   Marital status: Married    Spouse name: Not on file   Number of children: Not on file   Years of education: Not on file   Highest education level: Not on file  Occupational History   Occupation: retired    Associate Professor: [employer]  Tobacco Use   Smoking status: Never   Smokeless tobacco: Never  Vaping Use   Vaping status: Never Used  Substance and Sexual Activity   Alcohol  use: No   Drug use: No   Sexual activity: Not on file  Other Topics Concern   Not on file  Social History Narrative   Not on file   Social Drivers of Health   Financial Resource Strain: Low Risk  (09/17/2023)   Overall Financial Resource Strain (CARDIA)    Difficulty of Paying Living Expenses: Not hard at all  Food Insecurity: No  Food Insecurity (09/17/2023)   Hunger Vital Sign    Worried About Running Out of Food in the Last Year: Never true    Ran Out of Food in the Last Year: Never true  Transportation Needs: No Transportation Needs (09/17/2023)   PRAPARE - Administrator, Civil Service (Medical): No    Lack of Transportation (Non-Medical): No  Physical Activity: Sufficiently Active (09/17/2023)   Exercise Vital Sign    Days of Exercise per Week: 5 days    Minutes of Exercise per Session: 30 min  Stress: No Stress Concern Present (09/17/2023)   [employer] of Occupational Health - Occupational Stress Questionnaire    Feeling of Stress : Not at all  Social Connections: Socially Integrated (09/17/2023)   Social Connection and Isolation Panel    Frequency of Communication with Friends and Family: More than three times a week    Frequency of Social Gatherings with Friends and Family: Three times a week    Attends Religious Services: More than 4 times per year    Active Member of Clubs or Organizations: Yes    Attends Banker Meetings: More than 4 times per year    Marital Status: Married    Review of Systems Per HPI  Objective:  BP 128/72 (BP Location: Left Arm, Patient Position: Sitting, Cuff Size: Normal)   Pulse 70  Ht 5' 8.5 (1.74 m)   Wt 191 lb (86.6 kg)   SpO2 96%   BMI 28.62 kg/m      03/03/2024   11:07 AM 12/03/2023   11:11 AM 09/17/2023    2:25 PM  BP/Weight  Systolic BP 128 106 --  Diastolic BP 72 62 --  Wt. (Lbs) 191 192 193  BMI 28.62 kg/m2 27.95 kg/m2 28.09 kg/m2    Physical Exam Vitals and nursing note reviewed.  Constitutional:      General: He is not in acute distress.    Appearance: Normal appearance.  HENT:     Head: Normocephalic and atraumatic.  Eyes:     General:        Right eye: No discharge.        Left eye: No discharge.     Conjunctiva/sclera: Conjunctivae normal.  Cardiovascular:     Rate and Rhythm: Normal rate and regular rhythm.   Pulmonary:     Effort: Pulmonary effort is normal.     Breath sounds: Normal breath sounds. No wheezing, rhonchi or rales.  Musculoskeletal:     Comments: Normal strength of the right hand.  Neurological:     Mental Status: He is alert.  Psychiatric:        Mood and Affect: Mood normal.        Behavior: Behavior normal.     Lab Results  Component Value Date   WBC 7.2 09/03/2023   HGB 15.9 09/03/2023   HCT 47.5 09/03/2023   PLT 177 09/03/2023   GLUCOSE 186 (H) 03/03/2024   CHOL 196 03/03/2024   TRIG 144 03/03/2024   HDL 42 03/03/2024   LDLCALC 128 (H) 03/03/2024   ALT 21 03/03/2024   AST 20 03/03/2024   NA 140 03/03/2024   K 4.8 03/03/2024   CL 104 03/03/2024   CREATININE 1.02 03/03/2024   BUN 15 03/03/2024   CO2 19 (L) 03/03/2024   INR 1.07 07/16/2010   HGBA1C 7.0 (H) 03/03/2024     Assessment & Plan:  Numbness and tingling in right hand Assessment & Plan: Likely carpal tunnel.  Mildly troublesome.  We discussed getting a nerve conduction study.  He wants to wait at this time.   Type 2 diabetes mellitus with other circulatory complication, without long-term current use of insulin (HCC) Assessment & Plan: A1c to assess. Continue Glipizide .  Orders: -     CMP14+EGFR -     Hemoglobin A1c -     Microalbumin / creatinine urine ratio  Mixed hyperlipidemia Assessment & Plan: Lipid panel today.  Orders: -     Lipid panel    Follow-up:  3-6 months  Haskel Dewalt Bluford DO [facility]

## 2024-03-05 NOTE — Assessment & Plan Note (Signed)
 Likely carpal tunnel.  Mildly troublesome.  We discussed getting a nerve conduction study.  He wants to wait at this time.

## 2024-03-05 NOTE — Assessment & Plan Note (Signed)
 Lipid panel today

## 2024-03-05 NOTE — Assessment & Plan Note (Signed)
 A1c to assess. Continue Glipizide .

## 2024-03-13 NOTE — Telephone Encounter (Signed)
-----   Message from Jacqulyn KANDICE Ahle sent at 03/05/2024  9:28 PM EDT ----- A1c at goal.  Lipids not at goal. Consider PCSK9. ----- Message ----- From: Rebecka Memos Lab Results In Sent: 03/04/2024   6:36 AM EDT To: Jayce G Cook, DO

## 2024-03-13 NOTE — Telephone Encounter (Signed)
 Left message for patient to call back to discuss results.  OK for E2C2 to give information.

## 2024-03-14 ENCOUNTER — Ambulatory Visit: Payer: Self-pay

## 2024-03-14 NOTE — Telephone Encounter (Signed)
 FYI Only or Action Required?: Action required by provider: lab or test result follow-up needed.  Patient was last seen in primary care on 03/03/2024 by Cook, Jayce G, DO.  Called Nurse Triage reporting Results.  Symptoms began today.  Interventions attempted: Nothing.  Symptoms are: n/a.  Triage Disposition: Call PCP When Office is Open  Patient/caregiver understands and will follow disposition?: No, wishes to speak with PCP   Copied from CRM #[id number]. Topic: Clinical - Lab/Test Results >> Mar 14, 2024  9:06 AM Emylou G wrote: Reason for CRM: patient needs clarity on msg: Lipids not at goal. Consider PCSK9. Reason for Disposition  Caller requesting routine or non-urgent lab result  Answer Assessment - Initial Assessment Questions 1. REASON FOR CALL or QUESTION: What is your reason for calling today? or How can I best     Review lab results.    Reviewed lab results with patient. He is please with his progress on A1C. He understands cholesterol is high and is agreeable to medication if this is what Dr. Bluford recommends. Joseph Burton is requesting call back to discuss plan, if/when medication will be started, if he needs a follow up appointment to discuss plan? Please follow up with Gerre [phone]  Protocols used: PCP Call - No Triage-A-AH

## 2024-03-15 NOTE — Telephone Encounter (Signed)
 Called patient , phone did not allow for any voice messages to inform patient per Drs notes regarding Repatha  injectable cholesterol medication.

## 2024-03-17 NOTE — Telephone Encounter (Signed)
 Left message for a return call to inform patient per previous doctor's message regarding repatha 

## 2024-03-24 ENCOUNTER — Other Ambulatory Visit: Payer: Self-pay

## 2024-03-27 ENCOUNTER — Encounter: Payer: Self-pay | Admitting: Family Medicine

## 2024-03-27 NOTE — Telephone Encounter (Signed)
Unable to contact letter mailed to patient.

## 2024-03-28 ENCOUNTER — Other Ambulatory Visit: Payer: Self-pay | Admitting: Family Medicine

## 2024-03-31 ENCOUNTER — Other Ambulatory Visit: Payer: Self-pay | Admitting: Family Medicine

## 2024-04-01 ENCOUNTER — Other Ambulatory Visit: Payer: Self-pay | Admitting: Family Medicine

## 2024-04-04 NOTE — Progress Notes (Signed)
 Pt informed and verbalized understanding. I informed him that Dr Bluford is recommending that he start a medication for the bad cholesterol. He states that he will think about it and call us  back.

## 2024-04-18 DIAGNOSIS — L570 Actinic keratosis: Secondary | ICD-10-CM | POA: Diagnosis not present

## 2024-04-18 DIAGNOSIS — D485 Neoplasm of uncertain behavior of skin: Secondary | ICD-10-CM | POA: Diagnosis not present

## 2024-04-28 ENCOUNTER — Other Ambulatory Visit: Payer: Self-pay | Admitting: Family Medicine

## 2024-05-02 ENCOUNTER — Other Ambulatory Visit: Payer: Self-pay | Admitting: Family Medicine

## 2024-05-03 ENCOUNTER — Other Ambulatory Visit: Payer: Self-pay | Admitting: Family Medicine

## 2024-05-09 NOTE — Progress Notes (Signed)
 Pharmacy Quality Measure Review  This patient is appearing on a report for being at risk of failing the adherence measure for diabetes medications this calendar year.   Medication: Glipizide  5 mg Last fill date: 04/28/24 for 90 day supply, sold 05/03/24  Insurance report was not up to date. No action needed at this time.   Jenkins Graces, PharmD PGY1 Pharmacy Resident

## 2024-06-05 ENCOUNTER — Ambulatory Visit: Attending: Cardiology | Admitting: Cardiology

## 2024-06-05 ENCOUNTER — Encounter: Payer: Self-pay | Admitting: Cardiology

## 2024-06-05 VITALS — BP 118/75 | HR 77 | Ht 69.0 in | Wt 201.0 lb

## 2024-06-05 DIAGNOSIS — I10 Essential (primary) hypertension: Secondary | ICD-10-CM

## 2024-06-05 DIAGNOSIS — I25119 Atherosclerotic heart disease of native coronary artery with unspecified angina pectoris: Secondary | ICD-10-CM

## 2024-06-05 DIAGNOSIS — E782 Mixed hyperlipidemia: Secondary | ICD-10-CM | POA: Diagnosis not present

## 2024-06-05 MED ORDER — ATORVASTATIN CALCIUM 10 MG PO TABS: 10.0000 mg | ORAL_TABLET | Freq: Every day | ORAL | 3 refills | Status: AC

## 2024-06-05 MED ORDER — NITROGLYCERIN 0.4 MG SL SUBL: 0.4000 mg | SUBLINGUAL_TABLET | SUBLINGUAL | 3 refills | Status: AC | PRN

## 2024-06-05 NOTE — Progress Notes (Signed)
 "    Cardiology Office Note  Date: 06/05/2024   ID: Joseph Burton, DOB 1945/11/19, MRN [id number]  History of Present Illness: Joseph Burton is a 79 y.o. male last seen in January 2025.  He is here with his wife for a follow-up visit.  Reports no increasing pattern of angina, does have postprandial angina at times but does not have to use nitroglycerin  (only once in the last year).  Reports NYHA class II dyspnea, no palpitations or syncope.  We went over his medications.  It looks like he came off Lipitor, was having trouble with myalgias, reports prior statin myalgias with Zocor and Crestor.  His LDL increased to 128 as of October 2025.  I talked with him today about other alternatives such as PCSK9 inhibitors, Zetia, and bempedoic acid.  He was hesitant to make any medication changes, did agree to trying Lipitor at 10 mg daily and going from there.  He will follow-up with Dr. Bluford later this year for repeat lab work.  I reviewed his ECG today which shows sinus rhythm with prolonged PR interval and repolarization abnormalities.  Physical Exam: VS:  BP 118/75 (BP Location: Right Arm, Cuff Size: Large)   Pulse 77   Ht 5' 9 (1.753 m)   Wt 201 lb (91.2 kg)   SpO2 96%   BMI 29.68 kg/m , BMI Body mass index is 29.68 kg/m.  Wt Readings from Last 3 Encounters:  06/05/24 201 lb (91.2 kg)  03/03/24 191 lb (86.6 kg)  12/03/23 192 lb (87.1 kg)    General: Patient appears comfortable at rest. HEENT: Conjunctiva and lids normal. Neck: Supple, no elevated JVP or carotid bruits. Lungs: Clear to auscultation, nonlabored breathing at rest. Cardiac: Regular rate and rhythm, no S3, 2/6 systolic murmur. Extremities: No pitting edema.  ECG:  An ECG dated 05/24/2023 was personally reviewed today and demonstrated:  Sinus rhythm with rightward axis, nonspecific ST-T changes.  Labwork: 09/03/2023: Hemoglobin 15.9; Platelets 177 03/03/2024: ALT 21; AST 20; BUN 15; Creatinine, Ser 1.02;  Potassium 4.8; Sodium 140     Component Value Date/Time   CHOL 196 03/03/2024 1143   TRIG 144 03/03/2024 1143   HDL 42 03/03/2024 1143   CHOLHDL 4.7 03/03/2024 1143   LDLCALC 128 (H) 03/03/2024 1143   Other Studies Reviewed Today:  No interval cardiac testing for review today.  Assessment and Plan:  1.  Multivessel CAD status post CABG in 2002 (unable to locate graft anatomy in chart).  LVEF 65 to 70% by echocardiogram in 2017 at which point Myoview  indicated no significant ischemia.  He does report intermittent angina, postprandial at times, but no increasing nitroglycerin  use or change in functional capacity.  ECG reviewed.  Plan to continue medical therapy unless symptoms escalate.  Currently on aspirin  81 mg daily, resuming statin therapy as discussed below, and as needed nitroglycerin  with will be refilled.   2.  Mixed hyperlipidemia, LDL up to 128 in October 2025.  He has history of statin myalgias, several options discussed including PCSK9 inhibitors, Zetia, and the bempedoic acid acid.  He did not want to make any of these changes as yet and is willing to go back on Lipitor at 10 mg daily to see if he tolerates this better.  If so can repeat FLP with Dr. Bluford later this year.   3.  Primary hypertension.  Blood pressure well-controlled today.  Continue atenolol  25 mg daily.  Disposition:  Follow up 1 year.  Signed, Jayson MORTON  Debera, M.D., F.A.C.C. [facility]

## 2024-06-05 NOTE — Patient Instructions (Signed)
 Medication Instructions:   DECREASE Atorvastatin  to 10 mg daily   I refilled your NTG   Labwork: None today  Testing/Procedures: None today  Follow-Up: 1 year Dr.McDowell  Any Other Special Instructions Will Be Listed Below (If Applicable).  If you need a refill on your cardiac medications before your next appointment, please call your pharmacy.

## 2024-09-01 ENCOUNTER — Ambulatory Visit: Admitting: Family Medicine

## 2024-09-04 ENCOUNTER — Ambulatory Visit: Admitting: Family Medicine
